# Patient Record
Sex: Female | Born: 1965 | Race: White | Hispanic: No | Marital: Single | Smoking: Former smoker
Health system: Southern US, Community
[De-identification: ages and names within clinical notes are randomized; demographics above are authoritative.]

## PROBLEM LIST (undated history)

## (undated) DIAGNOSIS — I493 Ventricular premature depolarization: Secondary | ICD-10-CM

## (undated) DIAGNOSIS — R42 Dizziness and giddiness: Secondary | ICD-10-CM

## (undated) DIAGNOSIS — H9201 Otalgia, right ear: Secondary | ICD-10-CM

## (undated) DIAGNOSIS — D509 Iron deficiency anemia, unspecified: Secondary | ICD-10-CM

## (undated) DIAGNOSIS — G43829 Menstrual migraine, not intractable, without status migrainosus: Secondary | ICD-10-CM

## (undated) DIAGNOSIS — R202 Paresthesia of skin: Secondary | ICD-10-CM

## (undated) DIAGNOSIS — R442 Other hallucinations: Secondary | ICD-10-CM

## (undated) DIAGNOSIS — Z8679 Personal history of other diseases of the circulatory system: Secondary | ICD-10-CM

## (undated) DIAGNOSIS — I491 Atrial premature depolarization: Secondary | ICD-10-CM

## (undated) DIAGNOSIS — R002 Palpitations: Secondary | ICD-10-CM

## (undated) DIAGNOSIS — E785 Hyperlipidemia, unspecified: Secondary | ICD-10-CM

## (undated) DIAGNOSIS — M542 Cervicalgia: Secondary | ICD-10-CM

## (undated) HISTORY — DX: Dizziness and giddiness: R42

## (undated) HISTORY — DX: Hyperlipidemia, unspecified: E78.5

## (undated) HISTORY — DX: Paresthesia of skin: R20.2

## (undated) HISTORY — DX: Cervicalgia: M54.2

## (undated) HISTORY — DX: Menstrual migraine, not intractable, without status migrainosus: G43.829

## (undated) HISTORY — DX: Other hallucinations: R44.2

## (undated) HISTORY — DX: Ventricular premature depolarization: I49.3

## (undated) HISTORY — DX: Atrial premature depolarization: I49.1

## (undated) HISTORY — DX: Otalgia, right ear: H92.01

## (undated) HISTORY — DX: Personal history of other diseases of the circulatory system: Z86.79

## (undated) HISTORY — DX: Palpitations: R00.2

## (undated) HISTORY — DX: Iron deficiency anemia, unspecified: D50.9

---

## 1997-12-15 ENCOUNTER — Other Ambulatory Visit: Admission: RE | Admit: 1997-12-15 | Discharge: 1997-12-15 | Payer: Self-pay | Admitting: Obstetrics and Gynecology

## 1998-06-01 ENCOUNTER — Inpatient Hospital Stay (HOSPITAL_COMMUNITY): Admission: AD | Admit: 1998-06-01 | Discharge: 1998-06-01 | Payer: Self-pay | Admitting: Obstetrics and Gynecology

## 1998-06-07 ENCOUNTER — Inpatient Hospital Stay (HOSPITAL_COMMUNITY): Admission: AD | Admit: 1998-06-07 | Discharge: 1998-06-09 | Payer: Self-pay | Admitting: Obstetrics and Gynecology

## 1998-07-10 ENCOUNTER — Other Ambulatory Visit: Admission: RE | Admit: 1998-07-10 | Discharge: 1998-07-10 | Payer: Self-pay | Admitting: Obstetrics and Gynecology

## 1998-07-14 ENCOUNTER — Encounter: Payer: Self-pay | Admitting: Emergency Medicine

## 1998-07-14 ENCOUNTER — Inpatient Hospital Stay (HOSPITAL_COMMUNITY): Admission: EM | Admit: 1998-07-14 | Discharge: 1998-07-18 | Payer: Self-pay | Admitting: Emergency Medicine

## 1999-08-07 ENCOUNTER — Other Ambulatory Visit: Admission: RE | Admit: 1999-08-07 | Discharge: 1999-08-07 | Payer: Self-pay | Admitting: Obstetrics and Gynecology

## 2000-11-17 ENCOUNTER — Other Ambulatory Visit: Admission: RE | Admit: 2000-11-17 | Discharge: 2000-11-17 | Payer: Self-pay | Admitting: Obstetrics and Gynecology

## 2000-12-28 ENCOUNTER — Encounter: Admission: RE | Admit: 2000-12-28 | Discharge: 2000-12-28 | Payer: Self-pay | Admitting: Emergency Medicine

## 2000-12-28 ENCOUNTER — Encounter: Payer: Self-pay | Admitting: Emergency Medicine

## 2001-04-25 ENCOUNTER — Encounter: Payer: Self-pay | Admitting: Emergency Medicine

## 2001-04-25 ENCOUNTER — Emergency Department (HOSPITAL_COMMUNITY): Admission: EM | Admit: 2001-04-25 | Discharge: 2001-04-25 | Payer: Self-pay | Admitting: Emergency Medicine

## 2001-12-28 ENCOUNTER — Other Ambulatory Visit: Admission: RE | Admit: 2001-12-28 | Discharge: 2001-12-28 | Payer: Self-pay | Admitting: Obstetrics and Gynecology

## 2002-01-26 ENCOUNTER — Encounter: Payer: Self-pay | Admitting: Emergency Medicine

## 2002-01-26 ENCOUNTER — Encounter: Admission: RE | Admit: 2002-01-26 | Discharge: 2002-01-26 | Payer: Self-pay | Admitting: Emergency Medicine

## 2002-02-01 ENCOUNTER — Encounter: Payer: Self-pay | Admitting: Emergency Medicine

## 2002-02-01 ENCOUNTER — Encounter: Admission: RE | Admit: 2002-02-01 | Discharge: 2002-02-01 | Payer: Self-pay | Admitting: Emergency Medicine

## 2003-02-27 ENCOUNTER — Other Ambulatory Visit: Admission: RE | Admit: 2003-02-27 | Discharge: 2003-02-27 | Payer: Self-pay | Admitting: Obstetrics and Gynecology

## 2003-11-06 ENCOUNTER — Inpatient Hospital Stay (HOSPITAL_COMMUNITY): Admission: EM | Admit: 2003-11-06 | Discharge: 2003-11-07 | Payer: Self-pay | Admitting: Emergency Medicine

## 2004-01-19 ENCOUNTER — Encounter: Admission: RE | Admit: 2004-01-19 | Discharge: 2004-01-19 | Payer: Self-pay | Admitting: Emergency Medicine

## 2004-05-02 ENCOUNTER — Ambulatory Visit (HOSPITAL_COMMUNITY): Admission: RE | Admit: 2004-05-02 | Discharge: 2004-05-02 | Payer: Self-pay | Admitting: Cardiovascular Disease

## 2004-05-06 ENCOUNTER — Other Ambulatory Visit: Admission: RE | Admit: 2004-05-06 | Discharge: 2004-05-06 | Payer: Self-pay | Admitting: Obstetrics and Gynecology

## 2004-10-07 ENCOUNTER — Other Ambulatory Visit: Admission: RE | Admit: 2004-10-07 | Discharge: 2004-10-07 | Payer: Self-pay | Admitting: Obstetrics and Gynecology

## 2004-10-10 ENCOUNTER — Encounter: Admission: RE | Admit: 2004-10-10 | Discharge: 2004-10-10 | Payer: Self-pay | Admitting: Obstetrics and Gynecology

## 2005-07-02 ENCOUNTER — Other Ambulatory Visit: Admission: RE | Admit: 2005-07-02 | Discharge: 2005-07-02 | Payer: Self-pay | Admitting: Obstetrics and Gynecology

## 2006-02-17 ENCOUNTER — Ambulatory Visit: Payer: Self-pay | Admitting: Cardiovascular Disease

## 2006-03-11 ENCOUNTER — Encounter: Payer: Self-pay | Admitting: Internal Medicine

## 2006-03-11 ENCOUNTER — Ambulatory Visit: Payer: Self-pay

## 2006-03-11 HISTORY — PX: TRANSTHORACIC ECHOCARDIOGRAM: SHX275

## 2006-10-01 ENCOUNTER — Ambulatory Visit: Payer: Self-pay | Admitting: Cardiovascular Disease

## 2006-11-13 ENCOUNTER — Encounter: Admission: RE | Admit: 2006-11-13 | Discharge: 2006-11-13 | Payer: Self-pay | Admitting: Otolaryngology

## 2006-12-22 ENCOUNTER — Encounter: Admission: RE | Admit: 2006-12-22 | Discharge: 2006-12-22 | Payer: Self-pay | Admitting: Emergency Medicine

## 2007-03-11 ENCOUNTER — Encounter: Admission: RE | Admit: 2007-03-11 | Discharge: 2007-06-09 | Payer: Self-pay | Admitting: Neurology

## 2007-06-16 ENCOUNTER — Encounter: Admission: RE | Admit: 2007-06-16 | Discharge: 2007-06-16 | Payer: Self-pay | Admitting: Endocrinology

## 2007-10-12 ENCOUNTER — Ambulatory Visit: Payer: Self-pay | Admitting: Cardiovascular Disease

## 2008-06-26 ENCOUNTER — Encounter: Admission: RE | Admit: 2008-06-26 | Discharge: 2008-06-26 | Payer: Self-pay | Admitting: Endocrinology

## 2008-10-24 ENCOUNTER — Emergency Department (HOSPITAL_COMMUNITY): Admission: EM | Admit: 2008-10-24 | Discharge: 2008-10-24 | Payer: Self-pay | Admitting: Emergency Medicine

## 2008-11-15 DIAGNOSIS — R002 Palpitations: Secondary | ICD-10-CM | POA: Insufficient documentation

## 2008-11-15 DIAGNOSIS — I428 Other cardiomyopathies: Secondary | ICD-10-CM | POA: Insufficient documentation

## 2008-11-15 DIAGNOSIS — R209 Unspecified disturbances of skin sensation: Secondary | ICD-10-CM | POA: Insufficient documentation

## 2008-11-20 ENCOUNTER — Encounter: Payer: Self-pay | Admitting: Cardiovascular Disease

## 2008-11-20 ENCOUNTER — Ambulatory Visit: Payer: Self-pay

## 2008-11-20 ENCOUNTER — Ambulatory Visit: Payer: Self-pay | Admitting: Cardiovascular Disease

## 2009-09-12 ENCOUNTER — Encounter (INDEPENDENT_AMBULATORY_CARE_PROVIDER_SITE_OTHER): Payer: Self-pay | Admitting: *Deleted

## 2009-12-20 ENCOUNTER — Telehealth: Payer: Self-pay | Admitting: Cardiovascular Disease

## 2010-01-28 ENCOUNTER — Ambulatory Visit: Payer: Self-pay | Admitting: Cardiovascular Disease

## 2010-03-11 ENCOUNTER — Telehealth (INDEPENDENT_AMBULATORY_CARE_PROVIDER_SITE_OTHER): Payer: Self-pay | Admitting: *Deleted

## 2010-04-03 ENCOUNTER — Ambulatory Visit: Payer: Self-pay | Admitting: Cardiovascular Disease

## 2010-04-03 DIAGNOSIS — I10 Essential (primary) hypertension: Secondary | ICD-10-CM | POA: Insufficient documentation

## 2010-04-08 ENCOUNTER — Telehealth (INDEPENDENT_AMBULATORY_CARE_PROVIDER_SITE_OTHER): Payer: Self-pay | Admitting: *Deleted

## 2010-04-09 ENCOUNTER — Ambulatory Visit: Payer: Self-pay | Admitting: Cardiovascular Disease

## 2010-04-16 ENCOUNTER — Encounter: Payer: Self-pay | Admitting: Pulmonary Disease

## 2010-04-18 ENCOUNTER — Telehealth: Payer: Self-pay | Admitting: Cardiology

## 2010-05-17 ENCOUNTER — Ambulatory Visit: Payer: Self-pay | Admitting: Pulmonary Disease

## 2010-05-21 ENCOUNTER — Ambulatory Visit: Payer: Self-pay | Admitting: Cardiovascular Disease

## 2010-06-06 DIAGNOSIS — R079 Chest pain, unspecified: Secondary | ICD-10-CM | POA: Insufficient documentation

## 2010-06-12 ENCOUNTER — Telehealth (INDEPENDENT_AMBULATORY_CARE_PROVIDER_SITE_OTHER): Payer: Self-pay | Admitting: *Deleted

## 2010-06-23 ENCOUNTER — Encounter: Payer: Self-pay | Admitting: Endocrinology

## 2010-07-04 NOTE — Progress Notes (Signed)
Summary: event monitor  Phone Note Outgoing Call Call back at [phone]   Call placed by: Stanton Kidney, EMT-P,  April 08, 2010 11:46 AM Summary of Call: Left message for Pt. to call back for event monitor. Stanton Kidney, EMT-P  April 08, 2010 11:47 AM  Pt. left message for me to call back, when call returned, LMAM. Stanton Kidney, EMT-P  April 09, 2010 10:58 AM  S/W pt, requested monitor to be mailed, pt enrolled 04/09/10. Stanton Kidney, EMT-P  April 09, 2010 11:10 AM

## 2010-07-04 NOTE — Assessment & Plan Note (Signed)
Summary: heart fluttering/dizzy/heart rate fluctuating/mt   CC:  dizziness and fast heart rate.  History of Present Illness: Lauren [name] is seen today for palpitaitons and ? increasing HTN.  She has felt "weird" for 2-3 weeks and describes being "foggy in the head" no focal neuro signs.  Undsteady on feet.  No fever headache.  Thinks BP has been higher with diastolics of .  Feels rapid palpitaoitns and HR seems inappropriately high at times.  No SSCP, dyspnea or edema.  No postural componant.  History of PPDCM with recovery of function.  History of anxiety.  Has been taking altace since last visit.  Current Problems (verified): 1)  Palpitations  (ICD-785.1) 2)  Cardiomyopathy  (ICD-425.4) 3)  Paresthesia  (ICD-782.0)  Current Medications (verified): 1)  Altace 5 Mg Caps (Ramipril) .Marland Kitchen.. 1 Tab By Mouth Once Daily 2)  Aspirin .Marland Kitchen.. 1 Tab  Couple Times A Week  Allergies (verified): No Known Drug Allergies  Past History:  Past Medical History: Last updated: 11/15/2008 Current Problems:  PALPITATIONS (ICD-785.1) CARDIOMYOPATHY (ICD-425.4): post-partum 2002 PARESTHESIA (ICD-782.0)   Cervical spine problems  Right ear pain.  Family History: Last updated: 11/15/2008 noncontributory  Social History: Last updated: 11/15/2008 She is married.  She has two children ages 32 and 39.  She  drinks weekly.  She does not use cigarettes or recreational drugs.  Review of Systems       Denies fever, malais, weight loss, blurry vision, decreased visual acuity, cough, sputum, SOB, hemoptysis, pleuritic pain,, heartburn, abdominal pain, melena, lower extremity edema, claudication, or rash.   Vital Signs:  Patient profile:   45 year old female Height:      63 inches Weight:      113 pounds BMI:     20.09 Pulse rate:   78 / minute Resp:     14 per minute BP sitting:   130 / 80  (left arm) BP standing:   130 / 80  Vitals Entered By: Kem Parkinson (April 03, 2010 2:14 PM)  Physical  Exam  General:  Affect appropriate Healthy:  appears stated age HEENT: normal Neck supple with no adenopathy JVP normal no bruits no thyromegaly Lungs clear with no wheezing and good diaphragmatic motion Heart:  S1/S2 no murmur,rub, gallop or click PMI normal Abdomen: benighn, BS positve, no tenderness, no AAA no bruit.  No HSM or HJR Distal pulses intact with no bruits No edema Neuro non-focal Skin warm and dry    Impression & Recommendations:  Problem # 1:  PALPITATIONS (ICD-785.1) Event monitor sound benign   Her updated medication list for this problem includes:    Altace 5 Mg Caps (Ramipril) .Marland Kitchen... 1 tab by mouth once daily  Orders: Event (Event)  Problem # 2:  CARDIOMYOPATHY (ICD-425.4) No CHF with normalizaton of EF  Continue ACE Her updated medication list for this problem includes:    Altace 5 Mg Caps (Ramipril) .Marland Kitchen... 1 tab by mouth once daily  Problem # 3:  PARESTHESIA (ICD-782.0) ? URI or inner ear issue.  F/U primary If symtoms worsen or persist consdier CT/MRI  Problem # 4:  ESSENTIAL HYPERTENSION, BENIGN (ICD-401.1) Home BP reading consider adding BB or increasing ACE Her updated medication list for this problem includes:    Altace 5 Mg Caps (Ramipril) .Marland Kitchen... 1 tab by mouth once daily  Patient Instructions: 1)  Your physician recommends that you schedule a follow-up appointment in: 6-8 WEEKS WITH DR Eden Emms 2)  Your physician recommends that you continue on your  current medications as directed. Please refer to the Current Medication list given to you today. 3)  Your physician has recommended that you wear an event monitor.  Event monitors are medical devices that record the heart's electrical activity. Doctors most often use these monitors to diagnose arrhythmias. Arrhythmias are problems with the speed or rhythm of the heartbeat. The monitor is a small, portable device. You can wear one while you do your normal daily activities. This is usually used to  diagnose what is causing palpitations/syncope (passing out).

## 2010-07-04 NOTE — Progress Notes (Signed)
Summary: [facility] calling re pt's monitor  Phone Note From Other Clinic   Caller: [facility] [phone] Summary of Call: [facility] calling to let us know they have attempted to contact this pt re a monitor on four different occasions without success  Initial call taken by: Glynda Jaeger,  April 18, 2010 12:59 PM  Follow-up for Phone Call        ok, just document Follow-up by: Gaylord Shih, MD, [facility],  April 18, 2010 1:55 PM

## 2010-07-04 NOTE — Letter (Signed)
Summary: Appointment - Reminder 2  [facility] [employer], Main Office  [address]   [place], [place] [address]   Phone: [phone]  Fax: [phone]     September 12, 2009 MRN: [id number]   [facility] [address] [place], [place]  [address]   Dear Ms. Guia,  Our records indicate that it is time to schedule a follow-up appointment with Dr. Eden Emms. It is very important that we reach you to schedule this appointment. We look forward to participating in your health care needs. Please contact us at the number listed above at your earliest convenience to schedule your appointment.  If you are unable to make an appointment at this time, give [place] a call so we can update our records.     Sincerely,   Migdalia Dk [facility] Scheduling Team

## 2010-07-04 NOTE — Assessment & Plan Note (Signed)
Summary: consult for right sided chest pain   Copy to:  Herb Grays Primary Provider/Referring Provider:  Herb Grays  CC:  Pulmonary Consult.  History of Present Illness: The pt is a 45y/o female who I have been asked to see for right sided chest discomfort.  The pt has been having this pain for 6mos, and feels it has been staying the same intensity to better.  She states the pain is clearly positional, and is made worse by driving.  She thinks this is because she leans in a certain position whenever she drives.  The same position when not driving can also make the pain worse.  She denies any point tenderness, but it is worse with turning her torso.  She has no issues with her breathing, and denies any cough or mucus production.  She denies having any issue with the pain on vigorous exercise, and does not interfere with her exercise tolerance.  She has had rib films in Nov, with no acute process seen.  The pain is not pleuritic, and she has not had any LE edema.  No FHx of VTE or autoimmune disease.    Current Medications (verified): 1)  Altace 5 Mg Caps (Ramipril) .Marland Kitchen.. 1 Tab By Mouth Once Daily 2)  Aspirin .Marland Kitchen.. 1 Tab  Couple Times A Week 3)  Iron 325 (65 Fe) Mg Tabs (Ferrous Sulfate) .... Take 1 Tablet By Mouth Two Times A Day 4)  Vitamin B6 and B12 .... Take 1 Tablet By Mouth Once A Day  Allergies (verified): 1)  ! Doxycycline  Past History:  Past Medical History:  PALPITATIONS (ICD-785.1) CARDIOMYOPATHY (ICD-425.4): post-partum 2002 PARESTHESIA (ICD-782.0)   Cervical spine problems  Right ear pain.  Past Surgical History: denies surgical history  Family History: Reviewed history from 11/15/2008 and no changes required. cancer: paternal grandmother (unsure what kind) maternal grandmother ( female reprodutive organs)   Social History: Reviewed history from 11/15/2008 and no changes required. She is married.  She has two children ages 45 and 64.   pt works as a Economist.  Former smoker.  started at age 45.  less than 1 ppd.  quit 1990. drinks alcohol.  approx 3 to 5 drinks a week.   Review of Systems       The patient complains of irregular heartbeats, anxiety, and joint stiffness or pain.  The patient denies shortness of breath with activity, shortness of breath at rest, productive cough, non-productive cough, coughing up blood, chest pain, acid heartburn, indigestion, loss of appetite, weight change, abdominal pain, difficulty swallowing, sore throat, tooth/dental problems, headaches, nasal congestion/difficulty breathing through nose, sneezing, itching, ear ache, depression, hand/feet swelling, rash, change in color of mucus, and fever.    Vital Signs:  Patient profile:   45 year old female Height:      63 inches Weight:      117.25 pounds BMI:     20.84 O2 Sat:      96 % on Room air Temp:     98.1 degrees F oral Pulse rate:   62 / minute BP sitting:   104 / 78  (left arm) Cuff size:   regular  Vitals Entered By: Arman Filter LPN (June 06, 2010 1:45 PM)  O2 Flow:  Room air CC: Pulmonary Consult Comments Medications reviewed with patient Arman Filter LPN  June 06, 2010 1:45 PM    Physical Exam  General:  thin female in nad Eyes:  PERRLA.   Nose:  patent without discharge Mouth:  no exudates or lesions seen Neck:  no jvd, tmg, LN Lungs:  totally clear to auscultation Heart:  rrr, no mrg Abdomen:  soft and nontender, bs+ Extremities:  no edema or cyanosis  Neurologic:  alert, oriented, moves all 4.   Impression & Recommendations:  Problem # 1:  CHEST PAIN (ICD-786.50)  the pt is describing chest pain that I suspect is MSK in origin.  Her recent cxr/rib films showed no acute process, her description of the discomfort is not pleuritic in nature, and the pain can be worsened by twisting/changing position.  She is able to exercise vigorously without this bothering her, and without any pulmonary limitation.  I  suspect her discomfort is in the chest wall rather than in lungs.  I have left the door open with her that if this continues to be an issue or get worse, could consider getting ct chest for completeness.  Medications Added to Medication List This Visit: 1)  Iron 325 (65 Fe) Mg Tabs (Ferrous sulfate) .... Take 1 tablet by mouth two times a day 2)  Vitamin B6 and B12  .... Take 1 tablet by mouth once a day  Other Orders: Consultation Level IV ([address])  Patient Instructions: 1)  no further pulmonary w/u at this time.  I suspect this is in the chest wall.   2)  please call if symptoms worsen or persist.

## 2010-07-04 NOTE — Assessment & Plan Note (Signed)
Summary: follow up/mt   History of Present Illness: Lauren [name] is seen today in followup for atypical chest pain palpitations and history of dilated cardiomyopathy postpartum.  She had a 2-D echocardiogram 10/2008  which I reviewed.  He was normal with an EF of 55% and trivial MR.  She's done well.  She was also seen in the ER in 2010 for "panic attack with negative w.u and R/O.   Her 2 children and husband seemed to be doing well she's probably had 3 panic attacks in the last 3 years.  Nothing in particular brings them on.  She has no history coronary artery disease she has been taking her altace infrequently maybe 2-3 times per week.  She will call us to re-evalute EF if she stops her altace alltogether  Current Problems (verified): 1)  Palpitations  (ICD-785.1) 2)  Cardiomyopathy  (ICD-425.4) 3)  Paresthesia  (ICD-782.0)  Current Medications (verified): 1)  Altace 5 Mg Caps (Ramipril) .Marland Kitchen.. 1 Tab By Mouth Once Daily 2)  Aspirin .Marland Kitchen.. 1 Tab  Couple Times A Week  Allergies (verified): No Known Drug Allergies  Past History:  Past Medical History: Last updated: 11/15/2008 Current Problems:  PALPITATIONS (ICD-785.1) CARDIOMYOPATHY (ICD-425.4): post-partum 2002 PARESTHESIA (ICD-782.0)   Cervical spine problems  Right ear pain.  Family History: Last updated: 11/15/2008 noncontributory  Social History: Last updated: 11/15/2008 She is married.  She has two children ages 45 and 31.  She  drinks weekly.  She does not use cigarettes or recreational drugs.  Review of Systems       Denies fever, malais, weight loss, blurry vision, decreased visual acuity, cough, sputum, SOB, hemoptysis, pleuritic pain, palpitaitons, heartburn, abdominal pain, melena, lower extremity edema, claudication, or rash.   Vital Signs:  Patient profile:   45 year old female Height:      63 inches Weight:      115 pounds BMI:     20.44 Pulse rate:   63 / minute Resp:     12 per minute BP sitting:   116 / 68   (left arm)  Vitals Entered By: Kem Parkinson (January 28, 2010 4:23 PM)  Physical Exam  General:  Affect appropriate Healthy:  appears stated age HEENT: normal Neck supple with no adenopathy JVP normal no bruits no thyromegaly Lungs clear with no wheezing and good diaphragmatic motion Heart:  S1/S2 no murmur,rub, gallop or click PMI normal Abdomen: benighn, BS positve, no tenderness, no AAA no bruit.  No HSM or HJR Distal pulses intact with no bruits No edema Neuro non-focal Skin warm and dry    Impression & Recommendations:  Problem # 1:  CARDIOMYOPATHY (ICD-425.4) Normal ECG, and normal exam  Continue ACE.  She will call us if she stops it to re-evaluate EF off meds Her updated medication list for this problem includes:    Altace 5 Mg Caps (Ramipril) .Marland Kitchen... 1 tab by mouth once daily  Orders: EKG w/ Interpretation (93000)  Her updated medication list for this problem includes:    Altace 5 Mg Caps (Ramipril) .Marland Kitchen... 1 tab by mouth once daily  Patient Instructions: 1)  Your physician recommends that you schedule a follow-up appointment in: 1 YEAR.   EKG Report  Procedure date:  01/28/2010  Findings:      NSR 63 Normal ECG

## 2010-07-04 NOTE — Progress Notes (Signed)
Summary: does pt need echo  Phone Note Call from Patient   Caller: Patient Reason for Call: Talk to Nurse Summary of Call: pt wants to know if she needs an echo before her next visit? pls call [phone] Initial call taken by: Glynda Jaeger,  December 20, 2009 1:34 PM  Follow-up for Phone Call        [facility] Lisabeth Devoid RN No answer 2nd call at 5:02pm Lisabeth Devoid RN lmtcb Scherrie Bateman, LPN  December 21, 2009 8:24 AM  Additional Follow-up for Phone Call Additional follow up Details #1::        She does ot need and echo  Additional Follow-up by: Colon Branch, MD, [facility],  December 21, 2009 1:04 PM    Additional Follow-up for Phone Call Additional follow up Details #2::    I have left a message on the pt's identified vm that she does not need an echo prior to her visit. Follow-up by: Sherri Rad, RN, BSN,  December 21, 2009 1:26 PM

## 2010-07-04 NOTE — Progress Notes (Signed)
Summary: refill request  Phone Note Refill Request Message from:  Patient on March 11, 2010 9:54 AM  Refills Requested: Medication #1:  ALTACE 5 MG CAPS 1 tab by mouth once daily [place]/ pt #[id number]   Method Requested: Telephone to Pharmacy Initial call taken by: Glynda Jaeger,  March 11, 2010 9:54 AM    Prescriptions: ALTACE 5 MG CAPS (RAMIPRIL) 1 tab by mouth once daily  #30 x 12   Entered by:   Kem Parkinson   Authorized by:   Colon Branch, MD, [facility]   Signed by:   Kem Parkinson on 03/11/2010   Method used:   Electronically to        [facility]* (retail)       [address]       [place], [place]  [id number]       Ph: [phone]       Fax: [phone]   RxID:   [id number]

## 2010-07-04 NOTE — Progress Notes (Signed)
Summary: lmtcb /.cy  Phone Note Outgoing Call   Call placed by: Scherrie Bateman, LPN,  June 12, 2010 3:25 PM Call placed to: Patient Summary of Call: LM FOR PT TO CALL BACK  RE MONITOR PER DR Eden Emms SR WITH PAC'S NO SIG ARRYTHMIAS Initial call taken by: Scherrie Bateman, LPN,  June 12, 2010 3:26 PM  Follow-up for Phone Call        [facility] Scherrie Bateman, LPN  June 14, 2010 12:36 PM  [facility] Scherrie Bateman, LPN  June 18, 2010 9:57 AM   pt returned call-pls call [phone] Glynda Jaeger  June 18, 2010 10:07 AM  Additional Follow-up for Phone Call Additional follow up Details #1::        PT AWARE. Additional Follow-up by: Scherrie Bateman, LPN,  June 18, 2010 10:36 AM

## 2010-09-10 LAB — URINALYSIS, ROUTINE W REFLEX MICROSCOPIC
Bilirubin Urine: NEGATIVE
Glucose, UA: NEGATIVE mg/dL
Hgb urine dipstick: NEGATIVE
Ketones, ur: NEGATIVE mg/dL
Nitrite: NEGATIVE
Specific Gravity, Urine: 1.005 (ref 1.005–1.030)

## 2010-09-10 LAB — POCT I-STAT, CHEM 8
BUN: 14 mg/dL (ref 6–23)
Hemoglobin: 13.6 g/dL (ref 12.0–15.0)

## 2010-10-15 NOTE — Assessment & Plan Note (Signed)
[facility]                            CARDIOLOGY OFFICE NOTE   NAME:[name], Lauren A                   MRN:          [id number]  DATE:10/12/2007                            DOB:          [birth year]    HISTORY:  Lauren [name] returns today for followup.  She has had a  history of postpartum cardiomyopathy.   She has had this decrease in LV function with an EF of 15-20% with  moderate MR in January 2000.  I started seeing her back in 2007.  At  that time, her EF had improved to 50-55%.  She is doing well.  She has  also had palpitations, occasional PVCs and PVCs.  She seemed to be  improved.  Hamida is doing well.  She is not having any significant  palpitations, no shortness of breath.  In fact, she just completed a  mini-triathlon in [place].  She did well and actually beat her  husband's time.  She has not had any syncope, no lower extremity edema.  No PND or orthopnea.   REVIEW OF SYSTEMS:  Remarkable for some right ear pain.  She has seen  some people for this.  It is not clear what the etiology is.  She also  has had some neck pain and is seeing a [facility].  Her view of  systems otherwise negative.   MEDICATIONS:  Altace 5 mg a day.   PHYSICAL EXAMINATION:  GENERAL:  Exam is remarkable for a thin, healthy  white female in no distress.  Affect is appropriate.  VITAL SIGNS:  Weight is 115 which is stable.  Blood pressure is 105/69,  pulse 74 and regular, respiratory rate 14, afebrile.  HEENT:  Unremarkable.  I did look in her right ear and it was normal  with a good light reflex.  Normal tympanic membrane.  NECK:  Carotids are normal without bruit.  No lymphadenopathy,  thyromegaly or JVP elevation.  LUNGS:  Clear with good diaphragmatic motion.  No wheezing.  HEART:  S1 and S2 with normal heart sounds.  PMI normal.  ABDOMEN:  Benign.  Bowel sounds are positive.  No AAA, no tenderness, no  hepatosplenomegaly, no hepatojugular  reflux.  EXTREMITIES:  Distal pulses are intact, no edema.  NEURO:  Nonfocal.  SKIN:  Warm and dry.  No muscular weakness.   DIAGNOSTICS:  EKG is normal.   IMPRESSION:  1. History of dilated cardiomyopathy with ejection fraction 15-20% in      2000, improved.  I told the patient she could probably come off      Altace for the time being.  She will take it every other day.  She      is somewhat wedded to the drug and deems that it helps with her      improvement.  2. Right ear pain.  Follow up ENT.  No obvious tympanic membrane or      ear canal problems.  3. Cervical spine problems.  Continue to follow up with chiropractor      and continue with nerve stimulation therapy.  4. Cardiomyopathy seems to  be improved.  No functional limitations.      Follow up with me  May 2010 with echocardiogram.  Her last      echocardiogram was done in October 2007.     Noralyn Pick. Eden Emms, MD, [facility]  Electronically Signed    PCN/MedQ  DD: 10/12/2007  DT: 10/12/2007  Job #: [id number]

## 2010-10-18 NOTE — Discharge Summary (Signed)
NAMEJADAN, Lauren [name]                      ACCOUNT NO.:  [id number]   MEDICAL RECORD NO.:  [id number]                   PATIENT TYPE:  INP   LOCATION:  0343                                 FACILITY[facility]  [facility]   PHYSICIAN:  Charlies Constable, M.D. [facility]              DATE OF BIRTH:  1966-05-31   DATE OF ADMISSION:  11/06/2003  DATE OF DISCHARGE:  11/07/2003                           DISCHARGE SUMMARY - REFERRING   HISTORY:  Lauren [name] is a 45 year old white female who presented to the  emergency room with an episode of nausea, diarrhea that she had earlier on  the morning of admission after completing a 3-mile run post breakfast.  She  then later experienced intermittent palpitations associated with dizziness  and shortness of breath.  The patient could not tell if the heart rate was  going fast nor did she describe associated chest discomfort or loss of  consciousness.  Later that morning while at [facility] developing pictures  this occurred.  She came to the emergency room still complaining with  increased heart rate occasionally but the ER did not have any documentation.   Her history is notable for a postpartum cardiomyopathy in February of 2000.  Her EF per the patient was 16 to 20%. However, subsequent echocardiogram 6  months later showed an EF of 55% and an echocardiogram last year continued  to show EF of approximately 55%.  She was actually seen by Dr. Daleen Squibb  initially at [facility] but has been following up with a [facility]  cardiologist.   LABORATORY DATA:  Chest x-ray did not show any active disease.  H&H 13.5 and  38.9 normal indices, platelets 221, WBCs 6.8.  Sodium 140, potassium 3.9,  BUN 12, creatinine 0.7, magnesium 1.9.  TSH 1.022.  EKG is normal sinus  rhythm with a rate of 72, normal axis artifact, delayed R-wave.   HOSPITAL COURSE:  Lauren [name] was admitted to [facility] by Dr.  Graciela Husbands for observation.  Overnight she did not have any further symptoms  and  the monitor continued to show sinus bradycardia with normal sinus rhythm  without any ectopy.  Dr. Juanda Chance, after review of laboratory data and the  monitor, felt that the patient could be discharged home.  He felt that she  should be placed on a beta-blocker with follow up echocardiogram and  cardiology appointment.  He also noted at the time of discharge and  explained to the patient if she has recurring symptoms while on a beta-  blocker an event monitor should be obtained.   DISCHARGE DIAGNOSIS:  Tachypalpitations.   DISPOSITION:  She is discharged home on a new prescription of Toprol-XL 25  mg daily and asked to continue her Altace 5 mg daily.   RECOMMENDATIONS:  A low salt/fat/cholesterol diet.  She will have an  echocardiogram on Friday, June 10th, at 5 p.m. and she will also follow up  with Dr. Daleen Squibb  in the [place] office.     Joellyn Rued, P.A. [facility]                    Charlies Constable, M.D. [facility]    EW/MEDQ  D:  11/07/2003  T:  11/07/2003  Job:  [id number]   cc:   Reuben Likes, M.D.  [address]  [place]  [place] [address]  Fax: [phone]   Pricilla Riffle, M.D.

## 2010-10-18 NOTE — H&P (Signed)
Lauren [name], Lauren [name]                      ACCOUNT NO.:  [id number]   MEDICAL RECORD NO.:  [id number]                   PATIENT TYPE:  INP   LOCATION:  0102                                 FACILITY:  [facility]   PHYSICIAN:  Duke Salvia, M.D.               DATE OF BIRTH:  [birth year]   DATE OF ADMISSION:  11/06/2003  DATE OF DISCHARGE:                                HISTORY & PHYSICAL   HISTORY OF PRESENT ILLNESS:  Lauren [name] is a 46 year old woman with a  history of postpartum cardiomyopathy about 5 years ago following the birth  of her second child where her ejection fraction fell to about 15-20%.  There  was subsequent recovery of her ejection fraction over the next 6 months and  has been followed by Dr. Perlie Gold down at [facility] since then until his recent  departure.  She has had no congestive symptoms; she runs vigorously and is  quite fit.   She does have a history of occasional palpitations which she describes as a  pause and a hard heartbeat and calls these PVCs; she is probably right.  In  addition, she describes palpitations that are different from this that are  often irregular, fast, and last 15-20 seconds.   Today, having run 3-4 miles in the morning (temperature 70-75) she returned  home, had some diarrhea related to nausea, and then from there went to do  some chores with her son.  Her volume repletion is solely water.  She then  had the onset of these palpitations lasting 15-30 seconds.  She describes  them as irregular.  She taps them out as mildly fast.  They were associated  with lightheadedness.  They then abated.  Some minutes later they recurred,  again lasting 15-30 seconds, and this occurred on a third occasion and  finally came to the emergency room.   She was put on the monitor and then apparently had another similar set of  palpitations.  Unfortunately her telemetry strips are not marked.   The strips recorded at 1400 appear to be relatively normal with  mild P wave  morphology variations and consistent with a sinus arrhythmia and at 1635  there is a pulse of about 120 beats per minute.   Her past evaluation for this has included a Holter monitor showing PVCs.  It  has been felt because of normal pulses during some of these episodes that  these may be anxiety.   She does use caffeine - a couple of Cokes a day.   Her cardiac risk factors are otherwise negative.   MEDICATIONS:  Altace 5.   ALLERGIES:  No known drug allergies.   REVIEW OF SYSTEMS:  Noted on intake sheet from Mr. Rozell Searing and is not  recounted here but is also normal.   SOCIAL HISTORY:  She is married.  She has two children ages 58 and 8.  She  drinks weekly.  She does not use cigarettes or recreational drugs.   There is no prior surgery.   PHYSICAL EXAMINATION:  GENERAL:  She is a middle-aged Caucasian female in no  acute distress, somewhat anxious.  VITAL SIGNS:  Her blood pressure is 130/80, her pulse is 78 to 94, her  respirations were 16 and nonlabored, she was afebrile.  HEENT:  Demonstrated no icterus and no xanthomata.  Neck veins were flat.  The carotids were brisk and full bilaterally without bruits.  BACK:  Without kyphosis or scoliosis.  LUNGS:  Clear.  HEART:  Heart sounds were regular.  An S4 was appreciated by Mr. Serpe I did  not appreciate.  ABDOMEN:  Soft with active bowel sounds with a palpable midline pulsation  consistent with her relative thinness.  EXTREMITIES:  Demonstrated no clubbing, cyanosis, or edema and pulses were  intact.  NEUROLOGIC:  Grossly normal.   Electrocardiogram dated 1145 demonstrated sinus rhythm at 72 with intervals  of 0.19/0.07/0.39.  There are suggestions of a prior septal myocardial  infarction though I suspect that this is related to lead placement.   Electrolytes demonstrated a sodium of 140, potassium of 3.9, a magnesium of  1.9 (I think it was - although I know it was in normal range).   Hemoglobin was  also normal at 13.5.   IMPRESSION:  1. Tachypalpitations with some irregularity by history though no strips are     available of this.  2. 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  3. History of premature ventricular contractions.  4. History of anxiety.   Lauren [name] has palpitations today, the specifics of which are not clear.  What she describes sounds like frequent ectopy; I wonder whether this might  have been precipitated by her early morning run, relative electrolyte  depletion, with subsequent resolution as homeostasis is reestablished.  However, given her cardiomyopathy - albeit healed, it is reasonable to  observe her overnight.  Whether event recording will be useful will be  determined by the frequency of these episodes.   Based on the above, therefore:  1. Observe her overnight.  2. Review telemetry in the a.m. prior to discharge.  3. She can follow up with me or Dr. Antoine Poche if she desires or follow up     with [facility] as previously set.                                               Duke Salvia, M.D.    SCK/MEDQ  D:  11/06/2003  T:  11/07/2003  Job:  [id number]   cc:   Dr. Ardyth Harps at [facility], M.D.  [address]  [place]  [place] [address]  Fax: [phone]

## 2010-10-24 ENCOUNTER — Telehealth: Payer: Self-pay | Admitting: *Deleted

## 2010-10-24 ENCOUNTER — Telehealth: Payer: Self-pay | Admitting: Cardiovascular Disease

## 2010-10-24 ENCOUNTER — Ambulatory Visit (INDEPENDENT_AMBULATORY_CARE_PROVIDER_SITE_OTHER): Payer: PRIVATE HEALTH INSURANCE

## 2010-10-24 VITALS — BP 127/74 | HR 73 | Resp 18 | Ht 64.0 in | Wt 115.0 lb

## 2010-10-24 DIAGNOSIS — R002 Palpitations: Secondary | ICD-10-CM

## 2010-10-24 NOTE — Telephone Encounter (Signed)
Patient called with C/O of palpitations with lightheadedness. This is  happening more frequently  Now. She notice these symptoms more when at rest. Patient would like to be seen today. An appointment is made for an EKG with the nurse today at 12:30 PM. Patient aware.

## 2010-10-24 NOTE — Progress Notes (Signed)
Patient c/o of palpitations every 10 to 15 minutes. For the last 3 days. It comes in clusters, lasting for one minute. Patient feels lightheaded when it happened. Patient denies SOB.  B/P 126/74. Pt. Appears nervous and crying at times. She states she is having a stressful time.An EKG was done. EKG having PVC's per Dr. Eden Emms. Patient to have an appointment within a month or so. An appointment was made for patient on July 30 th at 2:00 Pm. Patient aware.

## 2010-10-24 NOTE — Telephone Encounter (Signed)
Per pt calling this am wanted to be seen today. C /O palpitation for the past few days. No chest pain.

## 2010-10-30 NOTE — Telephone Encounter (Signed)
none

## 2010-12-11 ENCOUNTER — Encounter: Payer: Self-pay | Admitting: Cardiovascular Disease

## 2010-12-30 ENCOUNTER — Ambulatory Visit (INDEPENDENT_AMBULATORY_CARE_PROVIDER_SITE_OTHER): Payer: PRIVATE HEALTH INSURANCE | Admitting: Cardiovascular Disease

## 2010-12-30 DIAGNOSIS — I10 Essential (primary) hypertension: Secondary | ICD-10-CM

## 2010-12-30 DIAGNOSIS — R002 Palpitations: Secondary | ICD-10-CM

## 2010-12-30 DIAGNOSIS — I4949 Other premature depolarization: Secondary | ICD-10-CM

## 2010-12-30 DIAGNOSIS — I493 Ventricular premature depolarization: Secondary | ICD-10-CM

## 2010-12-30 DIAGNOSIS — I428 Other cardiomyopathies: Secondary | ICD-10-CM

## 2010-12-30 HISTORY — PX: OTHER SURGICAL HISTORY: SHX169

## 2010-12-30 NOTE — Assessment & Plan Note (Signed)
BP under good control with ACE.  Low sodium diet.

## 2010-12-30 NOTE — Assessment & Plan Note (Signed)
Last imaging 2 years ago with PVC;s  Echo with stress

## 2010-12-30 NOTE — Patient Instructions (Addendum)
Your physician recommends that you schedule a follow-up appointment in: 6 MONTHS WITH DR [facility]   Your physician recommends that you continue on your current medications as directed. Please refer to the Current Medication list given to you today. Your physician has requested that you have a stress echocardiogram. For further information please visit [email or web address] Please follow instruction sheet as given. PT'S CONVENIENCE  DX 785.1  PVC'S  You have been referred to EP DX 785.1  AND PVC'S

## 2010-12-30 NOTE — Progress Notes (Signed)
Lauren [name] is seen today in followup for atypical chest pain palpitations and history of dilated cardiomyopathy postpartum. She had a 2-D echocardiogram 10/2008 which I reviewed. He was normal with an EF of 55% and trivial MR. She's done well. She was also seen in the ER in 2010 for "panic attack with negative w.u and R/O. Her 2 children and husband seemed to be doing well she's probably had 3 panic attacks in the last 3 years. Nothing in particular brings them on. She has no history coronary artery disease she has been taking her altace more regularly.    Seen in office for nurse visit 5/24.  Under a lot of stress. ECG with PVc;s  Still with palpitations that are ? Hormonal and definitely related to stress.  Not a candidate for BB due to low resting HR.     ROS: Denies fever, malais, weight loss, blurry vision, decreased visual acuity, cough, sputum, SOB, hemoptysis, pleuritic pain, palpitaitons, heartburn, abdominal pain, melena, lower extremity edema, claudication, or rash.  All other systems reviewed and negative  General: Affect appropriate Healthy:  appears stated age HEENT: normal Neck supple with no adenopathy JVP normal no bruits no thyromegaly Lungs clear with no wheezing and good diaphragmatic motion Heart:  S1/S2 no murmur,rub, gallop or click PMI normal Abdomen: benighn, BS positve, no tenderness, no AAA no bruit.  No HSM or HJR Distal pulses intact with no bruits No edema Neuro non-focal Skin warm and dry No muscular weakness   Current Outpatient Prescriptions  Medication Sig Dispense Refill  . ramipril (ALTACE) 5 MG capsule Take 5 mg by mouth daily.          Allergies  Doxycycline  Electrocardiogram:Tele 11/23- 12/13  NSR occsional PAC.  ECG 5/24 SR 77 PVC;s QT 428  Assessment and Plan

## 2010-12-30 NOTE — Assessment & Plan Note (Signed)
Although they are likely benign and related to stress and panic attacks she still complains about them.  After stress echo refer to EPS to see if there is anything to suppress

## 2011-01-06 ENCOUNTER — Telehealth: Payer: Self-pay | Admitting: Internal Medicine

## 2011-01-06 NOTE — Telephone Encounter (Signed)
Spoke with pt, pt is having trouble with tendonitis and is afraid she will not be able to walk for the testing. Per wanda deal pt can be switched to dobutamine if unable to walk when gets here Deliah Goody

## 2011-01-06 NOTE — Telephone Encounter (Signed)
Pt wants to talk you about canceling stress echo

## 2011-01-08 ENCOUNTER — Other Ambulatory Visit (HOSPITAL_COMMUNITY): Payer: PRIVATE HEALTH INSURANCE | Admitting: Radiology

## 2011-01-08 ENCOUNTER — Ambulatory Visit (HOSPITAL_COMMUNITY): Payer: PRIVATE HEALTH INSURANCE | Attending: Cardiovascular Disease | Admitting: Radiology

## 2011-01-08 ENCOUNTER — Ambulatory Visit (HOSPITAL_BASED_OUTPATIENT_CLINIC_OR_DEPARTMENT_OTHER): Payer: PRIVATE HEALTH INSURANCE | Admitting: Radiology

## 2011-01-08 ENCOUNTER — Other Ambulatory Visit (HOSPITAL_COMMUNITY): Payer: Self-pay | Admitting: Cardiovascular Disease

## 2011-01-08 DIAGNOSIS — R002 Palpitations: Secondary | ICD-10-CM | POA: Insufficient documentation

## 2011-01-08 DIAGNOSIS — I10 Essential (primary) hypertension: Secondary | ICD-10-CM | POA: Insufficient documentation

## 2011-01-08 DIAGNOSIS — Z87891 Personal history of nicotine dependence: Secondary | ICD-10-CM | POA: Insufficient documentation

## 2011-01-08 DIAGNOSIS — R072 Precordial pain: Secondary | ICD-10-CM | POA: Insufficient documentation

## 2011-01-08 DIAGNOSIS — I4949 Other premature depolarization: Secondary | ICD-10-CM

## 2011-01-08 DIAGNOSIS — R42 Dizziness and giddiness: Secondary | ICD-10-CM | POA: Insufficient documentation

## 2011-01-08 DIAGNOSIS — R0989 Other specified symptoms and signs involving the circulatory and respiratory systems: Secondary | ICD-10-CM

## 2011-01-08 DIAGNOSIS — R079 Chest pain, unspecified: Secondary | ICD-10-CM

## 2011-01-08 MED ORDER — ATROPINE SULFATE 0.1 MG/ML IJ SOLN
0.2500 mg | Freq: Once | INTRAMUSCULAR | Status: AC
  Administered 2011-01-08: 0.25 mg via INTRAVENOUS

## 2011-01-08 MED ORDER — SODIUM CHLORIDE 0.9 % IV SOLN
40.0000 ug/kg | Freq: Once | INTRAVENOUS | Status: AC
  Administered 2011-01-08: 40 ug/kg/min via INTRAVENOUS

## 2011-02-05 ENCOUNTER — Encounter: Payer: Self-pay | Admitting: Internal Medicine

## 2011-02-05 ENCOUNTER — Ambulatory Visit (INDEPENDENT_AMBULATORY_CARE_PROVIDER_SITE_OTHER): Payer: PRIVATE HEALTH INSURANCE | Admitting: Internal Medicine

## 2011-02-05 DIAGNOSIS — R002 Palpitations: Secondary | ICD-10-CM

## 2011-02-05 DIAGNOSIS — I4949 Other premature depolarization: Secondary | ICD-10-CM

## 2011-02-05 DIAGNOSIS — I428 Other cardiomyopathies: Secondary | ICD-10-CM

## 2011-02-05 DIAGNOSIS — I4891 Unspecified atrial fibrillation: Secondary | ICD-10-CM

## 2011-02-05 MED ORDER — VERAPAMIL HCL ER 120 MG PO TBCR: 120.0000 mg | EXTENDED_RELEASE_TABLET | Freq: Every day | ORAL | Status: DC

## 2011-02-05 NOTE — Patient Instructions (Signed)
Your physician recommends that you schedule a follow-up appointment in: 8 weeks with Dr Ladona Ridgel  Your physician has recommended that you wear a holter monitor. Holter monitors are medical devices that record the heart's electrical activity. Doctors most often use these monitors to diagnose arrhythmias. Arrhythmias are problems with the speed or rhythm of the heartbeat. The monitor is a small, portable device. You can wear one while you do your normal daily activities. This is usually used to diagnose what is causing palpitations/syncope (passing out).    Your physician has recommended you make the following change in your medication: Start Verapamil SR 120 mg daily

## 2011-02-05 NOTE — Assessment & Plan Note (Signed)
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 While they are unlikely to be malignant, it would be important to characterize the quantity of her PVCs as well as whether there are multiple foci or not. I recommended that the patient undergo 24-hour Holter monitoring followed by initiation of low-dose calcium channel blocker therapy. If her PVCs are more frequent than we initially thought, then more aggressive monitoring and follow up will be recommended. Particularly if there are less than 10,000 PVCs in a 24-hour period, I would recommend attempts to treat her symptoms with the calcium channel blocker. Additional therapy would be recommended depending on the density of the PVCs as well as her response to initial therapy. I'll see the patient back in several months, sooner if she has more frequent ventricular ectopy.

## 2011-02-05 NOTE — Progress Notes (Signed)
HPI Lauren [name] is referred today by Dr. Eden Emms for evaluation of palpitations. The patient has a history of a postpartum cardiomyopathy diagnosed in 2002. She had subsequent normalization of her left ventricular dysfunction. She has had intermittent palpitations since then but over the last few months these have increased in frequency and severity. She denies syncope. She underwent stress echo demonstrated normal left ventricular systolic function. The patient notes that every day she has palpitations. These are not necessarily related to exertion. She has never had frank syncope. At times she will feel dizzy and lightheaded and this may last up to 5 seconds. She has been on no medical therapy for this. Allergies  Allergen Reactions  . Doxycycline     REACTION: rash     Current Outpatient Prescriptions  Medication Sig Dispense Refill  . ramipril (ALTACE) 5 MG capsule Take 5 mg by mouth daily.        . verapamil (CALAN-SR) 120 MG CR tablet Take 1 tablet (120 mg total) by mouth daily.  30 tablet  11     Past Medical History  Diagnosis Date  . Palpitations   . Cardiomyopathy     post-partum 2002  . Paresthesia   . Pain of cervical spine   . Right ear pain     ROS:   All systems reviewed and negative except as noted in the HPI.   No past surgical history on file.   No family history on file.   History   Social History  . Marital Status: Married    Spouse Name: N/A    Number of Children: N/A  . Years of Education: N/A   Occupational History  . Not on file.   Social History Main Topics  . Smoking status: Former Games developer  . Smokeless tobacco: Not on file   Comment: started at 16, smoked less than 1 ppd; quit in 1990  . Alcohol Use: 0.0 oz/week    0 drink(s) per week     approximately 3-5 drinks/week   . Drug Use: Not on file  . Sexually Active: Yes -- Female partner(s)   Other Topics Concern  . 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 HEART:  Iregular rate rhythm, no murmurs, no rubs, no clicks Abd:  soft, positive bowel sounds, no organomegally, no rebound, no guarding Ext:  2 plus pulses, no edema, no cyanosis, no clubbing Skin:  No rashes no nodules Neuro:  CN II through XII intact, motor grossly intact  EKG Normal sinus rhythm with frequent PVCs originating from the right ventricular outflow tract. Transition from negative to positive his in lead V3.  Assess/Plan:

## 2011-02-05 NOTE — Assessment & Plan Note (Signed)
With normal left ventricular systolic function by echo, there is no indication that her cardiomyopathy has recurred.

## 2011-02-12 ENCOUNTER — Telehealth: Payer: Self-pay

## 2011-03-31 ENCOUNTER — Other Ambulatory Visit: Payer: Self-pay | Admitting: *Deleted

## 2011-03-31 MED ORDER — RAMIPRIL 5 MG PO CAPS: 5.0000 mg | ORAL_CAPSULE | Freq: Every day | ORAL | Status: DC

## 2011-04-02 ENCOUNTER — Ambulatory Visit: Payer: PRIVATE HEALTH INSURANCE | Admitting: Internal Medicine

## 2011-04-03 NOTE — Telephone Encounter (Signed)
Patient never called back for monitor

## 2011-04-23 ENCOUNTER — Ambulatory Visit: Payer: PRIVATE HEALTH INSURANCE | Admitting: Internal Medicine

## 2011-06-17 ENCOUNTER — Ambulatory Visit (INDEPENDENT_AMBULATORY_CARE_PROVIDER_SITE_OTHER): Payer: PRIVATE HEALTH INSURANCE | Admitting: Internal Medicine

## 2011-06-17 ENCOUNTER — Encounter: Payer: Self-pay | Admitting: Internal Medicine

## 2011-06-17 DIAGNOSIS — R002 Palpitations: Secondary | ICD-10-CM

## 2011-06-17 DIAGNOSIS — I10 Essential (primary) hypertension: Secondary | ICD-10-CM

## 2011-06-17 NOTE — Progress Notes (Signed)
HPI [name] returns today for followup. She is a 46 year old woman with a history of palpitations and documented PVCs. I saw her initially approximately 4 months ago and at that time recommended a trial of verapamil. She took the medicine for several weeks but then her symptoms improved and she stopped the medications. Since then she has done well off of medical therapy. She has had occasional breakthroughs of palpitations but these have been self-limited. She denies chest pain, shortness of breath, or syncope. Allergies  Allergen Reactions  . Doxycycline     REACTION: rash     Current Outpatient Prescriptions  Medication Sig Dispense Refill  . ESTROGENS CONJUGATED PO Take 1 tablet by mouth once a week.      . ramipril (ALTACE) 5 MG capsule Take 1 capsule (5 mg total) by mouth daily.  30 capsule  12  . THYROID PO Take 2 tablets by mouth daily.         Past Medical History  Diagnosis Date  . Palpitations   . Cardiomyopathy     post-partum 2002  . Paresthesia   . Pain of cervical spine   . Right ear pain     ROS:   All systems reviewed and negative except as noted in the HPI.   Past Surgical History  Procedure Date  . Echocardiogram stress test 12/30/10  . Transthoracic echocardiogram 03/11/06     Family History  Problem Relation Age of Onset  . Cancer Maternal Grandmother     reproductive organs  . Cancer Paternal Grandmother      History   Social History  . Marital Status: Married    Spouse Name: N/A    Number of Children: N/A  . Years of Education: N/A   Occupational History  . Not on file.   Social History Main Topics  . Smoking status: Former Games developer  . Smokeless tobacco: Not on file   Comment: started at 16, smoked less than 1 ppd; quit in 1990  . Alcohol Use: 0.0 oz/week    0 drink(s) per week     approximately 3-5 drinks/week   . Drug Use: Not on file  . Sexually Active: Yes -- Female partner(s)   Other Topics Concern  . 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 HEART:  Regular rate rhythm, no murmurs, no rubs, no clicks Abd:  soft, positive bowel sounds, no organomegally, no rebound, no guarding Ext:  2 plus pulses, no edema, no cyanosis, no clubbing Skin:  No rashes no nodules Neuro:  CN II through XII intact, motor grossly intact   Assess/Plan:

## 2011-06-17 NOTE — Patient Instructions (Signed)
Your physician recommends that you schedule a follow-up appointment as needed  

## 2011-06-17 NOTE — Assessment & Plan Note (Signed)
At this point her symptoms appear to be for the most part controlled off of medical therapy. I have recommended watchful waiting. If her symptoms return, she may restart her verapamil.

## 2011-06-17 NOTE — Assessment & Plan Note (Signed)
Her blood pressure appears to be well-controlled. She will maintain a low-sodium diet and continue her current medical therapy. 

## 2012-05-03 ENCOUNTER — Other Ambulatory Visit: Payer: Self-pay | Admitting: *Deleted

## 2012-05-03 MED ORDER — RAMIPRIL 5 MG PO CAPS: 5.0000 mg | ORAL_CAPSULE | Freq: Every day | ORAL | Status: DC

## 2012-05-04 ENCOUNTER — Other Ambulatory Visit: Payer: Self-pay | Admitting: *Deleted

## 2012-05-04 MED ORDER — RAMIPRIL 5 MG PO CAPS: 5.0000 mg | ORAL_CAPSULE | Freq: Every day | ORAL | Status: DC

## 2012-11-22 ENCOUNTER — Other Ambulatory Visit: Payer: Self-pay | Admitting: Neurology

## 2012-11-23 ENCOUNTER — Telehealth: Payer: Self-pay | Admitting: *Deleted

## 2012-11-23 NOTE — Telephone Encounter (Signed)
I called and left a message for the patient to callback to the office to r/s appt.

## 2013-01-03 ENCOUNTER — Telehealth: Payer: Self-pay | Admitting: Neurology

## 2013-01-04 ENCOUNTER — Other Ambulatory Visit: Payer: Self-pay | Admitting: Obstetrics and Gynecology

## 2013-01-04 DIAGNOSIS — R928 Other abnormal and inconclusive findings on diagnostic imaging of breast: Secondary | ICD-10-CM

## 2013-01-04 NOTE — Telephone Encounter (Signed)
Spoke to patient and she complains of progressively worse symptoms of her neuropathy.  She said there is twitching on the outer part of her feet and palm, and many other things going on.  She was last seen 07-16-12 and was suppose to have a follow upon 11-26-12 but office rescheduled for Jan 2015, she does not want to wait that long.  [phone]

## 2013-01-04 NOTE — Telephone Encounter (Signed)
I called the patient. The patient is having new sensory symptoms. All symptoms are subjective. The patient was having left-sided symptoms, now spreading to the right. We will try to work in sometime in the next month.

## 2013-01-05 ENCOUNTER — Encounter: Payer: Self-pay | Admitting: Neurology

## 2013-01-05 ENCOUNTER — Ambulatory Visit (INDEPENDENT_AMBULATORY_CARE_PROVIDER_SITE_OTHER): Payer: Managed Care, Other (non HMO) | Admitting: Neurology

## 2013-01-05 VITALS — BP 136/77 | HR 58 | Ht 64.0 in | Wt 115.0 lb

## 2013-01-05 DIAGNOSIS — R209 Unspecified disturbances of skin sensation: Secondary | ICD-10-CM

## 2013-01-05 MED ORDER — TOPIRAMATE 25 MG PO TABS: ORAL_TABLET | ORAL | Status: DC

## 2013-01-05 NOTE — Progress Notes (Signed)
Reason for visit: Numbness  Lauren [name] is an 47 y.o. 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 The patient indicates that her husband recently has been diagnosed with lung cancer. The patient has been under increased stress. The patient indicates that she will have tingling sensations on her feet and hands that come and go. The patient may have vibration sensations on the legs and thighs and she will have some muscle twitches that affect her hands and feet. The patient denies any overt pain or weakness. The patient denies any balance issues or problems controlling the bowels or the bladder. 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 The patient is on propranolol taking 20 mg twice daily. The patient is tolerating this medication well, and she takes Treximet when the headache comes on. Occasionally, she will have vertigo. The patient denies any visual field changes. The patient comes to this office for an evaluation.  Past Medical History  Diagnosis Date  . Palpitations   . Cardiomyopathy     post-partum 2002  . Paresthesia   . Pain of cervical spine   . Right ear pain   . 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 Dizziness   . Menstrual migraine   . History of cardiomyopathy   . Iron deficiency anemia     Past Surgical History  Procedure Laterality Date  . Echocardiogram stress test  12/30/10  . Transthoracic echocardiogram  03/11/06    Family History  Problem Relation Age of Onset  . Cancer Maternal Grandmother     reproductive organs  . Cancer Paternal Grandmother   . Hypertension  Father     Social history:  reports that she has quit smoking. She does not have any smokeless tobacco history on file. She reports that  drinks alcohol. She reports that she does not use illicit drugs.  Allergies:  Allergies  Allergen Reactions  . Doxycycline     REACTION: rash    Medications:  No current outpatient prescriptions on file prior to visit.   No current facility-administered medications on file prior to visit.    ROS:  Out of a complete 14 system review of symptoms, the patient complains only of the following symptoms, and all other reviewed systems are negative.  Dizziness Blurred vision feeling hot, flushing Muscle spasms Headache, numbness, weakness Anxiety, not enough sleep, decreased energy  Blood pressure 136/77, pulse 58, height 5\' 4"  (1.626 m), weight 115 lb (52.164 kg).  Physical Exam  General: The patient is alert and cooperative at the time of the examination.  Skin: No significant peripheral edema is noted.   Neurologic Exam  Cranial nerves: Facial symmetry is present. Speech is normal, no aphasia or dysarthria is noted. Extraocular movements are full. Visual fields are full.  Motor: The patient has good strength in all 4 extremities.  Sensory: Pinprick, soft touch, and vibration sensation are symmetric throughout.  Coordination: The patient has good finger-nose-finger and heel-to-shin bilaterally.  Gait and station: The patient has a normal gait. Tandem gait is normal. Romberg is negative. No drift is seen.  Reflexes: Deep tendon reflexes are symmetric.  Assessment/Plan:  1. Migratory sensory alteration  2. Menstrual migraine  The patient will be switched from propranolol to Topamax. The patient will continue the Treximet. MRI evaluation of the cervical spine will be done. The tortuosity of the basilar artery with indentation of the brainstem could potentially be the source of her sensory complaints. The patient followup in 6  months.  Marlan Palau MD 01/05/2013 7:46 PM  [facility] [address] [place], [place] [address]  Phone [phone] Fax [phone]

## 2013-01-06 ENCOUNTER — Telehealth: Payer: Self-pay | Admitting: Neurology

## 2013-01-10 ENCOUNTER — Telehealth: Payer: Self-pay | Admitting: Neurology

## 2013-01-10 ENCOUNTER — Ambulatory Visit
Admission: RE | Admit: 2013-01-10 | Discharge: 2013-01-10 | Disposition: A | Discharge status: Home / Self Care | Payer: Managed Care, Other (non HMO) | Source: Ambulatory Visit | Attending: Obstetrics and Gynecology | Admitting: Obstetrics and Gynecology

## 2013-01-10 DIAGNOSIS — R928 Other abnormal and inconclusive findings on diagnostic imaging of breast: Secondary | ICD-10-CM

## 2013-01-10 NOTE — Telephone Encounter (Signed)
Called patient. Left vmail.

## 2013-01-11 MED ORDER — PROPRANOLOL HCL 40 MG PO TABS: 40.0000 mg | ORAL_TABLET | Freq: Two times a day (BID) | ORAL | Status: DC

## 2013-01-11 NOTE — Telephone Encounter (Signed)
I called patient. The patient does not wish to go on Topamax. We will try switching her to propranolol, taking 20 mg the morning and 40 mg in the evening for 2 weeks, and then go to 40 mg twice daily. I'll call in a prescription for the 40 mg tablets.

## 2013-01-11 NOTE — Telephone Encounter (Signed)
Patient says in her last OV she said she would like to try Topamax. She now does not want to try Topamax, but would rather increase beta blocker.

## 2013-01-14 ENCOUNTER — Other Ambulatory Visit: Payer: PRIVATE HEALTH INSURANCE

## 2013-01-17 ENCOUNTER — Telehealth: Payer: Self-pay | Admitting: Neurology

## 2013-01-17 NOTE — Telephone Encounter (Signed)
I got a call from the primary care physician today. The patient is having increased problems with anxiety. The patient is on 10 mg twice daily of propranolol, and her systolic blood pressure is 102, and her heart rate is in the 50s. Do not feel go higher on this dose. The patient will be potentially placed on Effexor or clonazepam for her anxiety.

## 2013-01-19 ENCOUNTER — Other Ambulatory Visit: Payer: Self-pay | Admitting: Neurology

## 2013-01-19 DIAGNOSIS — R209 Unspecified disturbances of skin sensation: Secondary | ICD-10-CM

## 2013-01-19 NOTE — Progress Notes (Signed)
MRI of the cervical spine was approved as a with and without contrast study. I will need to put another order in for this study, as the prior order was for without only.

## 2013-01-20 ENCOUNTER — Ambulatory Visit (INDEPENDENT_AMBULATORY_CARE_PROVIDER_SITE_OTHER): Payer: Managed Care, Other (non HMO)

## 2013-01-20 DIAGNOSIS — R209 Unspecified disturbances of skin sensation: Secondary | ICD-10-CM

## 2013-01-21 ENCOUNTER — Telehealth: Payer: Self-pay | Admitting: Neurology

## 2013-01-21 MED ORDER — GADOPENTETATE DIMEGLUMINE 469.01 MG/ML IV SOLN: 10.0000 mL | Freq: Once | INTRAVENOUS | Status: AC | PRN

## 2013-01-21 NOTE — Telephone Encounter (Signed)
I called the patient. The cervical spine MRI does show some degenerative disc disease, no evidence of demyelinating disease in the spinal cord or compression of the spinal cord.

## 2013-02-10 ENCOUNTER — Telehealth: Payer: Self-pay | Admitting: Cardiovascular Disease

## 2013-02-10 NOTE — Telephone Encounter (Signed)
LMTCB

## 2013-02-10 NOTE — Telephone Encounter (Signed)
New Problem  Pt recently stoped taking her altace asks if this is a problem going forward// wants to know if she is due for an echo as well.

## 2013-02-11 NOTE — Telephone Encounter (Signed)
EF is normal so long as her BP ok at home no need for altace

## 2013-02-11 NOTE — Telephone Encounter (Signed)
LMTCB ./CY 

## 2013-02-16 NOTE — Telephone Encounter (Signed)
PT  NOTIFIED ./CY 

## 2013-05-02 ENCOUNTER — Other Ambulatory Visit: Payer: Self-pay | Admitting: Obstetrics and Gynecology

## 2013-05-02 DIAGNOSIS — N6001 Solitary cyst of right breast: Secondary | ICD-10-CM

## 2013-05-05 ENCOUNTER — Ambulatory Visit
Admission: RE | Admit: 2013-05-05 | Discharge: 2013-05-05 | Disposition: A | Discharge status: Home / Self Care | Payer: Commercial Indemnity | Source: Ambulatory Visit | Attending: Obstetrics and Gynecology | Admitting: Obstetrics and Gynecology

## 2013-05-05 DIAGNOSIS — N6001 Solitary cyst of right breast: Secondary | ICD-10-CM

## 2013-05-17 ENCOUNTER — Telehealth: Payer: Self-pay | Admitting: Neurology

## 2013-05-17 MED ORDER — RIZATRIPTAN BENZOATE 10 MG PO TBDP: 10.0000 mg | ORAL_TABLET | Freq: Three times a day (TID) | ORAL | Status: DC | PRN

## 2013-05-17 NOTE — Telephone Encounter (Signed)
I called patient. The patient is having some side effects on Treximet. I will try Maxalt. The patient is concerned about the potential for the serotonin syndrome with the use of Prozac. I do not think that this is clinically significant.

## 2013-06-09 ENCOUNTER — Ambulatory Visit: Payer: Self-pay | Admitting: Neurology

## 2013-07-08 ENCOUNTER — Ambulatory Visit: Payer: Managed Care, Other (non HMO) | Admitting: Nurse Practitioner

## 2013-10-10 ENCOUNTER — Encounter: Payer: Self-pay | Admitting: Cardiovascular Disease

## 2013-10-26 ENCOUNTER — Encounter: Payer: Self-pay | Admitting: Cardiovascular Disease

## 2013-10-27 ENCOUNTER — Encounter: Payer: Self-pay | Admitting: Adult Health

## 2013-10-27 ENCOUNTER — Encounter (INDEPENDENT_AMBULATORY_CARE_PROVIDER_SITE_OTHER): Payer: Self-pay

## 2013-10-27 ENCOUNTER — Ambulatory Visit (INDEPENDENT_AMBULATORY_CARE_PROVIDER_SITE_OTHER): Payer: Managed Care, Other (non HMO) | Admitting: Adult Health

## 2013-10-27 VITALS — BP 124/60 | HR 90 | Ht 65.0 in | Wt 118.0 lb

## 2013-10-27 DIAGNOSIS — G43909 Migraine, unspecified, not intractable, without status migrainosus: Secondary | ICD-10-CM

## 2013-10-27 DIAGNOSIS — R209 Unspecified disturbances of skin sensation: Secondary | ICD-10-CM

## 2013-10-27 MED ORDER — TOPIRAMATE 25 MG PO TABS: ORAL_TABLET | ORAL | Status: DC

## 2013-10-27 NOTE — Patient Instructions (Signed)
Recurrent Migraine Headache A migraine headache is very bad, throbbing pain on one or both sides of your head. Recurrent migraines keep coming back. Talk to your doctor about what things may bring on (trigger) your migraine headaches. HOME CARE  Only take medicines as told by your doctor.  Lie down in a dark, quiet room when you have a migraine.  Keep a journal to find out if certain things bring on migraine headaches. For example, write down:  What you eat and drink.  How much sleep you get.  Any change to your diet or medicines.  Lessen how much alcohol you drink.  Quit smoking if you smoke.  Get enough sleep.  Lessen any stress in your life.  Keep lights dim if bright lights bother you or make your migraines worse. GET HELP IF:  Medicine does not help your migraines.  Your pain keeps coming back. GET HELP RIGHT AWAY IF:   Your migraine becomes really bad.  You have a fever.  You have a stiff neck.  You have trouble seeing.  Your muscles are weak, or you lose muscle control.  You lose your balance or have trouble walking.  You feel like you will pass out (faint), or you pass out.  You have really bad symptoms that are different than your first symptoms. MAKE SURE YOU:   Understand these instructions.  Will watch your condition.  Will get help right away if you are not doing well or get worse. Document Released: 02/26/2008 Document Revised: 03/09/2013 Document Reviewed: 01/24/2013 [facility] Patient Information 2014 [place], [place].

## 2013-10-27 NOTE — Progress Notes (Signed)
PATIENT: [name] DOB: 1965/09/07  REASON FOR VISIT: follow up HISTORY FROM: patient  HISTORY OF PRESENT ILLNESS: Ms. 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 She returns today for follow-up. Patient was started on Effexor and klonopin and that has helped her anxiety. The patient started taking magnesium and states that since then she only has about 7 days out of the month where she will have sensory alterations. Those consist of vibration sensation in the ankles, some numbness over knees, and tightening of muscles in the lower legs. Patient is no longer taking the propanolol- she is unsure why she stopped. She was given a prescription for Topamax but never started because of the potential side effects. The patient currently has approximately 16 headaches a month however they are dull headaches. She uses the treximet frequently to prevent severe migraines. She is using up her medication (treximet) rapidly due to the amount of headaches she has. Patient states that noise and light bothers her but denies nausea or vomiting. Patient has a history of headaches since she was 48 years old. No new medical issues since the last visit.   REVIEW OF SYSTEMS: Full 14 system review of systems performed and notable only for:  Constitutional: N/A  Eyes: N/A Ear/Nose/Throat: N/A  Skin: N/A  Cardiovascular: N/A  Respiratory: N/A  Gastrointestinal: N/A  Genitourinary: N/A Hematology/Lymphatic: N/A  Endocrine: N/A Musculoskeletal: neck stiffness Allergy/Immunology: N/A  Neurological: dizziness, headache, tremors Psychiatric: N/A Sleep: N/A   ALLERGIES: Allergies  Allergen Reactions  . Doxycycline     REACTION: rash    HOME MEDICATIONS: Outpatient Prescriptions Prior to Visit  Medication Sig Dispense Refill  . propranolol (INDERAL) 40 MG tablet Take 1 tablet (40 mg total) by mouth 2 (two) times  daily.  60 tablet  3  . rizatriptan (MAXALT-MLT) 10 MG disintegrating tablet Take 1 tablet (10 mg total) by mouth 3 (three) times daily as needed for migraine. May repeat in 2 hours if needed  9 tablet  3   No facility-administered medications prior to visit.    PAST MEDICAL HISTORY: Past Medical History  Diagnosis Date  . Palpitations   . Cardiomyopathy     post-partum 2002  . Paresthesia   . Pain of cervical spine   . Right ear pain   . 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 Dizziness   . Menstrual migraine   . History of cardiomyopathy   . Iron deficiency anemia     PAST SURGICAL HISTORY: Past Surgical History  Procedure Laterality Date  . Echocardiogram stress test  12/30/10  . Transthoracic echocardiogram  03/11/06    FAMILY HISTORY: Family History  Problem Relation Age of Onset  . Cancer Maternal Grandmother     reproductive organs  . Cancer Paternal Grandmother   . Hypertension Father     SOCIAL HISTORY: History   Social History  . Marital Status: Married    Spouse Name: Lauren [name]    Number of Children: 2  . Years of Education: 14   Occupational History  . Physical therapy assistant   .     Social History Main Topics  . Smoking status: Former Games developer  . Smokeless tobacco: Never Used     Comment: started at 16, smoked less than 1 ppd; quit in 1990  . Alcohol Use: 0.0 oz/week    0 drink(s) per week  Comment: approximately 3-5 drinks/week   . Drug Use: No  . Sexual Activity: Yes    Partners: Male   Other Topics Concern  . Not on file   Social History Narrative   Patient is Married (Lauren [name]) and lives at home with her family.   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   Patient is left-handed.   Patient drinks one cup of coffee and two cups per day.      PHYSICAL EXAM  Filed Vitals:   10/27/13 0919  BP: 124/60  Pulse: 90  Height: 5\' 5"  (1.651  m)  Weight: 118 lb (53.524 kg)   Body mass index is 19.64 kg/(m^2).  Generalized: Well developed, in no acute distress   Neurological examination  Mentation: Alert oriented to time, place, history taking. Follows all commands speech and language fluent Cranial nerve II-XII: Extraocular movements were full, visual field were full on confrontational test.  Motor: The motor testing reveals 5 over 5 strength of all 4 extremities. Good symmetric motor tone is noted throughout.  Sensory: Sensory testing is intact to soft touch on all 4 extremities. No evidence of extinction is noted.  Coordination: Cerebellar testing reveals good finger-nose-finger and heel-to-shin bilaterally.  Gait and station: Gait is normal. Tandem gait is normal. Romberg is negative. No drift is seen.  Reflexes: Deep tendon reflexes are symmetric and normal bilaterally.    DIAGNOSTIC DATA (LABS, IMAGING, TESTING) - I reviewed patient records, labs, notes, testing and imaging myself where available.  Lab Results  Component Value Date   HGB 13.6 10/24/2008   HCT 40.0 10/24/2008      Component Value Date/Time   NA 140 10/24/2008 1436   K 3.7 10/24/2008 1436   CL 108 10/24/2008 1436   GLUCOSE 98 10/24/2008 1436   BUN 14 10/24/2008 1436   CREATININE 0.7 10/24/2008 1436    ASSESSMENT AND PLAN 48 y.o. 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 here with;  1. Disturbance of skin sensation 2. Migraines  Patient reports that sensory alterations have improved. However she is having approximately 16 headaches a month. She uses the treximet and that prevents then from developing into a severe migraine headache. In the past the patient was on propanolol and she stopped that for unknown reason. She was  prescribed Topamax but did not take it after reading about the side effects. I spoke to the patient about starting preventative medication for her headaches. She is open to trying the Topamax. She will start Topamax 25 mg 1 tablet daily at night for 1 week then increase to 2 tablets daily at night. She was advise to let us know if she developed side effects and at that time we could adjust her medication. If patient is unsuccessful with oral medications for migraines she may be a candidate for Botox injections. Patient should follow-up in 3-4 months or sooner if needed.    Lauren Penny, MSN, NP-C 10/27/2013, 9:25 AM [facility] [address] [place], [place] [address] [phone]  Note: This document was prepared with digital dictation and possible smart phrase technology. Any transcriptional errors that result from this process are unintentional.

## 2013-10-27 NOTE — Progress Notes (Signed)
I have read the note, and I agree with the clinical assessment and plan.  Pradyun Ishman K Bela Nyborg   

## 2013-12-27 ENCOUNTER — Telehealth: Payer: Self-pay | Admitting: *Deleted

## 2013-12-27 NOTE — Telephone Encounter (Signed)
I called patient. I left a message. The patient is wanting more Treximet, but I am concerned that she may be taking more than she should. The patient may be taking as much is 20 tablets a month. The patient is only on 50 mg daily of the Topamax, likely she go on the dose unless she cannot tolerate this. She has an appointment at the end of September 2015, I'll try to get an earlier appointment to help manage her medications.

## 2013-12-27 NOTE — Telephone Encounter (Signed)
It does not appear we have prescribed Treximet for this patient.  I called back.  The patient said she was getting the Rx through another office.  Says she has about 10 headache days per month, and takes one Treximet every 12 hours on each of those days.  (Her friend gives her the extra tablets).  Says she has been taking Topamax for 2 months, and does not feel like it has been beneficial.  She does not wish to have the Topamax dose increased.  Patient would like to know if we will prescribe regular Sumatriptan for her to take for migraines, or provide suggestions as to what she can do.  Please advise.  Thank you.

## 2014-01-09 ENCOUNTER — Ambulatory Visit (INDEPENDENT_AMBULATORY_CARE_PROVIDER_SITE_OTHER): Payer: Managed Care, Other (non HMO) | Admitting: Adult Health

## 2014-01-09 ENCOUNTER — Encounter: Payer: Self-pay | Admitting: Adult Health

## 2014-01-09 VITALS — BP 139/91 | HR 62 | Ht 65.0 in | Wt 119.0 lb

## 2014-01-09 DIAGNOSIS — G43829 Menstrual migraine, not intractable, without status migrainosus: Secondary | ICD-10-CM | POA: Insufficient documentation

## 2014-01-09 DIAGNOSIS — R209 Unspecified disturbances of skin sensation: Secondary | ICD-10-CM

## 2014-01-09 NOTE — Patient Instructions (Signed)

## 2014-01-09 NOTE — Progress Notes (Signed)
PATIENT: Lauren [name] DOB: 10/23/1965  REASON FOR VISIT: follow up HISTORY FROM: patient  HISTORY OF PRESENT ILLNESS Lauren [name] is a 48 year old female with a history of sensory alteration in her arms and legs and migraines. She currently takes treximet and Topamax for her headaches. She states that she has been having some side effects since taking the Topamax. She has aching feeling her hands and feet. At this time she no longer wants to take the Topamax nor any other medication. Patient's headache coincide with her menstrual cycle. She believes that she is premenopausal. Her menstrual cycle is not regular therefore she can have headaches throughout the month. She has an appointment with her OBGYN in the next few months.    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 She returns today for follow-up. Patient was started on Effexor and klonopin and that has helped her anxiety. The patient started taking magnesium and states that since then she only has about 7 days out of the month where she will have sensory alterations. Those consist of vibration sensation in the ankles, some numbness over knees, and tightening of muscles in the lower legs. Patient is no longer taking the propanolol- she is unsure why she stopped. She was given a prescription for Topamax but never started because of the potential side effects. The patient currently has approximately 16 headaches a month however they are dull headaches. She uses the treximet frequently to prevent severe migraines. She is using up her medication (treximet) rapidly due to the amount of headaches she has. Patient states that noise and light bothers her but denies nausea or vomiting. Patient has a history of headaches since she was 48 years old. No new medical issues since the last visit.    REVIEW OF SYSTEMS: Full 14 system review of systems performed and notable only for:  Constitutional: N/A    Eyes: N/A Ear/Nose/Throat: N/A  Skin: N/A  Cardiovascular: N/A  Respiratory: N/A  Gastrointestinal: Diarrhea  Genitourinary: N/A Hematology/Lymphatic: N/A  Endocrine: N/A Musculoskeletal: joint pain and back pain  Allergy/Immunology: N/A  Neurological: dizziness Psychiatric: N/A Sleep: insomnia   ALLERGIES: Allergies  Allergen Reactions  . Doxycycline     REACTION: rash    HOME MEDICATIONS: Outpatient Prescriptions Prior to Visit  Medication Sig Dispense Refill  . amoxicillin (AMOXIL) 500 MG capsule As needed for dental      . clonazePAM (KLONOPIN) 0.5 MG tablet 1 tablet daily.      . Magnesium 200 MG TABS Take 1 tablet by mouth 2 (two) times daily.      . Multiple Vitamins-Minerals (MULTIVITAMIN PO) Take 1 tablet by mouth daily.      Marland Kitchen TREXIMET 85-500 MG per tablet Take 1 tablet by mouth as needed.       . venlafaxine XR (EFFEXOR-XR) 75 MG 24 hr capsule 1 capsule daily.      Marland Kitchen topiramate (TOPAMAX) 25 MG tablet Take 1 tablet daily at bedtime for 1 week then increase to 2 tablets daily at bedtime  60 tablet  3  . amoxicillin-clavulanate (AUGMENTIN) 875-125 MG per tablet        No facility-administered medications prior to visit.    PAST MEDICAL HISTORY: Past Medical History  Diagnosis Date  . Palpitations   . Cardiomyopathy     post-partum 2002  . Paresthesia   . Pain of cervical spine   . Right ear pain   . Alteration in sensory  perception as evidenced by illusions   . Dizziness   . Menstrual migraine   . History of cardiomyopathy   . Iron deficiency anemia     PAST SURGICAL HISTORY: Past Surgical History  Procedure Laterality Date  . Echocardiogram stress test  12/30/10  . Transthoracic echocardiogram  03/11/06    FAMILY HISTORY: Family History  Problem Relation Age of Onset  . Cancer Maternal Grandmother     reproductive organs  . Cancer Paternal Grandmother   . Hypertension Father     SOCIAL HISTORY: History   Social History  . Marital  Status: Married    Spouse Name: [name]    Number of Children: 2  . Years of Education: 14   Occupational History  . Physical therapy assistant   .     Social History Main Topics  . Smoking status: Former Games developer  . Smokeless tobacco: Never Used     Comment: started at 16, smoked less than 1 ppd; quit in 1990  . Alcohol Use: 0.0 oz/week    0 drink(s) per week     Comment: approximately 3-5 drinks/week   . Drug Use: No  . Sexual Activity: Yes    Partners: Male   Other Topics Concern  . Not on file   Social History Narrative   Patient is Married ([name]) and lives at home with her family.   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   Patient is left-handed.   Patient drinks one cup of coffee and two cups per day.      PHYSICAL EXAM  Filed Vitals:   01/09/14 0908  BP: 139/91  Pulse: 62  Height: 5\' 5"  (1.651 m)  Weight: 119 lb (53.978 kg)   Body mass index is 19.8 kg/(m^2).  Generalized: Well developed, in no acute distress   Neurological examination  Mentation: Alert oriented to time, place, history taking. Follows all commands speech and language fluent Cranial nerve II-XII: Pupils were equal round reactive to light. Extraocular movements were full, visual field were full on confrontational test.  Motor: The motor testing reveals 5 over 5 strength of all 4 extremities. Good symmetric motor tone is noted throughout.  Sensory: Sensory testing is intact to soft touch on all 4 extremities. No evidence of extinction is noted.  Coordination: Cerebellar testing reveals good finger-nose-finger and heel-to-shin bilaterally.  Gait and station: Gait is normal. Tandem gait is normal. Romberg is negative. No drift is seen.  Reflexes: Deep tendon reflexes are symmetric and normal bilaterally.    DIAGNOSTIC DATA (LABS, IMAGING, TESTING) - I reviewed patient records, labs, notes, testing and imaging myself  where available.  Lab Results  Component Value Date   HGB 13.6 10/24/2008   HCT 40.0 10/24/2008      Component Value Date/Time   NA 140 10/24/2008 1436   K 3.7 10/24/2008 1436   CL 108 10/24/2008 1436   GLUCOSE 98 10/24/2008 1436   BUN 14 10/24/2008 1436   CREATININE 0.7 10/24/2008 1436       ASSESSMENT AND PLAN 48 y.o. 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 here with:  1. migraines related to menstrual cycle 2. Intermittent Sensory alterations and arms and legs  Patient states that she is  not happy being on Topamax. She feels that it has some unwanted side effects. Patient is currently only taking 25 mg once a day. She can stop this medication. Patient feels that she is premenopausal and her headaches coincide with her menstrual cycle. At this time I believe that her headaches would be better controlled if she was on birth control to help manage her menstrual cycles. Patient agrees. She will followup with her OB/GYN and perhaps start a birth control Pill. Patient sensory alterations in her arms and legs are very random now. She states that since she started taking magnesium twice a day this has significantly decreased. She should continue taking magnesium twice a day. Patient should followup in 6 months or sooner if needed.  Butch Penny, MSN, NP-C 01/09/2014, 9:14 AM [facility] [address], [address] [place], [place] [address] [phone]  Note: This document was prepared with digital dictation and possible smart phrase technology. Any transcriptional errors that result from this process are unintentional.

## 2014-01-09 NOTE — Addendum Note (Signed)
Addended by: Enedina FinnerMILLIKAN, Auri Jahnke P on: 01/09/2014 11:29 AM   Modules accepted: Orders, Medications

## 2014-02-28 ENCOUNTER — Ambulatory Visit: Payer: Managed Care, Other (non HMO) | Admitting: Adult Health

## 2014-03-23 ENCOUNTER — Other Ambulatory Visit: Payer: Self-pay | Admitting: Endocrinology

## 2014-03-23 DIAGNOSIS — E049 Nontoxic goiter, unspecified: Secondary | ICD-10-CM

## 2014-04-11 ENCOUNTER — Other Ambulatory Visit: Payer: Commercial Indemnity

## 2014-06-29 ENCOUNTER — Ambulatory Visit
Admission: RE | Admit: 2014-06-29 | Discharge: 2014-06-29 | Disposition: A | Discharge status: Home / Self Care | Payer: Commercial Indemnity | Source: Ambulatory Visit | Attending: Endocrinology | Admitting: Endocrinology

## 2014-06-29 DIAGNOSIS — E049 Nontoxic goiter, unspecified: Secondary | ICD-10-CM

## 2014-07-10 ENCOUNTER — Other Ambulatory Visit: Payer: Self-pay | Admitting: Physician Assistant

## 2014-07-10 DIAGNOSIS — R0989 Other specified symptoms and signs involving the circulatory and respiratory systems: Secondary | ICD-10-CM

## 2014-07-10 DIAGNOSIS — R4702 Dysphasia: Secondary | ICD-10-CM

## 2014-07-11 ENCOUNTER — Ambulatory Visit
Admission: RE | Admit: 2014-07-11 | Discharge: 2014-07-11 | Disposition: A | Discharge status: Home / Self Care | Payer: Managed Care, Other (non HMO) | Source: Ambulatory Visit | Attending: Physician Assistant | Admitting: Physician Assistant

## 2014-07-11 DIAGNOSIS — R4702 Dysphasia: Secondary | ICD-10-CM

## 2014-07-11 DIAGNOSIS — R0989 Other specified symptoms and signs involving the circulatory and respiratory systems: Secondary | ICD-10-CM

## 2014-07-12 ENCOUNTER — Ambulatory Visit: Payer: Managed Care, Other (non HMO) | Admitting: Adult Health

## 2014-08-02 ENCOUNTER — Ambulatory Visit: Payer: Managed Care, Other (non HMO) | Admitting: Adult Health

## 2014-09-05 ENCOUNTER — Ambulatory Visit: Payer: Managed Care, Other (non HMO) | Admitting: Adult Health

## 2015-05-22 ENCOUNTER — Ambulatory Visit: Payer: Managed Care, Other (non HMO) | Attending: Orthopedic Surgery

## 2015-05-22 DIAGNOSIS — M5441 Lumbago with sciatica, right side: Secondary | ICD-10-CM | POA: Insufficient documentation

## 2015-05-22 DIAGNOSIS — M25512 Pain in left shoulder: Secondary | ICD-10-CM | POA: Insufficient documentation

## 2015-05-22 DIAGNOSIS — R29898 Other symptoms and signs involving the musculoskeletal system: Secondary | ICD-10-CM

## 2015-05-22 DIAGNOSIS — M6281 Muscle weakness (generalized): Secondary | ICD-10-CM | POA: Insufficient documentation

## 2015-05-22 NOTE — Patient Instructions (Signed)
Piriformis (Supine)  Cross legs, right on top. Gently pull other knee toward chest until stretch is felt in buttock/hip of top leg. Hold ____ seconds. Repeat ____ times per set. Do ____ sets per session. Do ____ sessions per day.     Stretching: Piriformis (Supine)  Pull right knee toward opposite shoulder. Hold ____ seconds. Relax. Repeat ____ times per set. Do ____ sets per session. Do ____ sessions per day.   Resisted Horizontal Abduction: Bilateral   Sit or stand, tubing in both hands, arms out in front. Keeping arms straight, pinch shoulder blades together and stretch arms out. Repeat _10___ times per set. Do 2____ sets per session. Do _1-2___ sessions per day.  Bridge    Lie back, legs bent. Inhale, pressing hips up. Keeping ribs in, lengthen lower back. Exhale, rolling down along spine from top.  Hold 5 seconds Repeat _10___ times. Do __2__ sessions per day.  http://pm.exer.us/55   Copyright  VHI. All rights reserved.    [facility] [address], [address] [place], [place] [address] Phone # [phone] Fax [phone]

## 2015-05-22 NOTE — Therapy (Signed)
[facility] [address], [address] [place], [place], [address] Phone: [phone]   Fax:  [phone]  Physical Therapy Evaluation  Patient Details  Name: Lauren [name] MRN: [id number] Date of Birth: 06-Mar-1966 Referring Provider: Anette Riedel, [place]  Encounter Date: 05/22/2015      PT End of Session - 05/22/15 1520    Visit Number 1   Date for PT Re-Evaluation 07/17/15   PT Start Time 1430   PT Stop Time 1514   PT Time Calculation (min) 44 min   Activity Tolerance Patient tolerated treatment well   Behavior During Therapy [facility] for tasks assessed/performed      Past Medical History  Diagnosis Date  . Palpitations   . Cardiomyopathy     post-partum 2002  . Paresthesia   . Pain of cervical spine   . Right ear pain   . 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 Dizziness   . Menstrual migraine   . History of cardiomyopathy   . Iron deficiency anemia     Past Surgical History  Procedure Laterality Date  . Echocardiogram stress test  12/30/10  . Transthoracic echocardiogram  03/11/06    There were no vitals filed for this visit.  Visit Diagnosis:  Right-sided low back pain with right-sided sciatica - Plan: PT plan of care cert/re-cert  Pain in joint of left shoulder - Plan: PT plan of care cert/re-cert  Weakness of back - Plan: PT plan of care cert/re-cert      Subjective Assessment - 05/22/15 1434    Subjective Pt is a 49 y.o.  Lt hand dominant female who presents to PT with complaints of LBP that began 6 months ago without incident or injury.  Pt also reports Lt shoulder pain that began 3 months ago witout cause.  Pt reports that MRI showed possible posterior labral tear and she will see a specialist soon .  PT will evaluate Lt shoulder but hold treatment until she sees MD per her request.     Diagnostic tests MRI of shoulder: possible posterior labral tear.  MRI of lumbar spine: moderate  DDD, no significant disc issues per pt report.     Patient Stated Goals reduce LBP, reduce shoulder pain    Currently in Pain? Yes   Pain Score 4    Pain Location Back   Pain Orientation Right;Lower   Pain Descriptors / Indicators Burning;Sharp   Pain Type Chronic pain   Pain Radiating Towards Rt hip/gluteals   Pain Onset More than a month ago   Pain Frequency Intermittent   Aggravating Factors  positional: twising, bending, coming to stand after sitting too long   Pain Relieving Factors stopping the aggravating motion, Aleve, rolling on hard foam roll.   Multiple Pain Sites Yes   Pain Score 6   Pain Location Shoulder   Pain Orientation Left   Pain Descriptors / Indicators Sharp;Aching;Dull   Pain Type Chronic pain   Pain Onset More than a month ago   Pain Frequency Constant   Aggravating Factors  lifting, shoulder abduction, reaching across the body   Pain Relieving Factors not performing aggravating activity            [facility] PT Assessment - 05/22/15 0001    Assessment   Medical Diagnosis chronic Rt sided low back pain with Rt sciatica (M54.41), Pain of left upper extremity (M79.602), acute pain of Lt shoulder (M25.512)   Referring Provider Anette Riedel, [place]  PT LONG TERM GOAL #2   Title reduce FOTO to < or = to 36% limitation   Time 8   Period Weeks   Status New   PT LONG TERM GOAL #3   Title report 60% reduction in LBP with home and work tasks   Time 8   Period Weeks   Status New   PT LONG TERM GOAL #4   Title verbalize and demonstrate progression of core stabilization exercises    Time 8   Period Weeks   Status New   PT LONG TERM GOAL #5   Title report a 60% reduction in Lt shoulder pain with reaching out or across the body   Time 8   Period Weeks   Status New               Plan - 05/22/15 1521    Clinical Impression Statement Pt presents to PT with complaints of 6 month history of Rt sided lumbar/SI joint pain without cause and 3 month history of Lt shoulder pain without injury. Pt stopped all regular exercise due to pain.  Pt demonstrates painul Lt shoulder motion against gravity, weakness in the posterior shoulder/scapula, core weakness and tension in bilateral lumbar paraspinals.  FOTO score is 43% limitation.  Pt will benefit from progression of core stabilization and hip/lumbar flexibility, body mechanics education and manual/modalities/strength to Lt shoulder with emphasis on scapular stabilization.     Pt will benefit from skilled therapeutic intervention in order to improve on the following deficits Decreased strength;Impaired flexibility;Pain   Rehab Potential Good   PT Frequency 1x / week   PT Duration 8 weeks   PT Treatment/Interventions ADLs/Self Care Home Management;Cryotherapy;Electrical Stimulation;Moist Heat;Therapeutic exercise;Therapeutic activities;Ultrasound;Neuromuscular re-education;Patient/family education;Manual  techniques;Passive range of motion;Taping   PT Next Visit Plan Pilates based core instruction (level one-TA), scapular stabilization, manual to bilateral lumbar parspinals.  See what MD says about Lt shoulder   Consulted and Agree with Plan of Care Patient         Problem List Patient Active Problem List   Diagnosis Date Noted  . Headache, menstrual migraine 01/09/2014  . CHEST PAIN 06/06/2010  . ESSENTIAL HYPERTENSION, BENIGN 04/03/2010  . CARDIOMYOPATHY 11/15/2008  . PARESTHESIA 11/15/2008  . PALPITATIONS 11/15/2008    Charae Depaolis, PT 05/22/2015, 3:32 PM  [facility] [address], [address] [place], [place], [address] Phone: [phone]   Fax:  [phone]  Name: Lauren SowRebecca A Ashworth MRN: [id number] Date of Birth: 12/09/1965  PT LONG TERM GOAL #2   Title reduce FOTO to < or = to 36% limitation   Time 8   Period Weeks   Status New   PT LONG TERM GOAL #3   Title report 60% reduction in LBP with home and work tasks   Time 8   Period Weeks   Status New   PT LONG TERM GOAL #4   Title verbalize and demonstrate progression of core stabilization exercises    Time 8   Period Weeks   Status New   PT LONG TERM GOAL #5   Title report a 60% reduction in Lt shoulder pain with reaching out or across the body   Time 8   Period Weeks   Status New               Plan - 05/22/15 1521    Clinical Impression Statement Pt presents to PT with complaints of 6 month history of Rt sided lumbar/SI joint pain without cause and 3 month history of Lt shoulder pain without injury. Pt stopped all regular exercise due to pain.  Pt demonstrates painul Lt shoulder motion against gravity, weakness in the posterior shoulder/scapula, core weakness and tension in bilateral lumbar paraspinals.  FOTO score is 43% limitation.  Pt will benefit from progression of core stabilization and hip/lumbar flexibility, body mechanics education and manual/modalities/strength to Lt shoulder with emphasis on scapular stabilization.     Pt will benefit from skilled therapeutic intervention in order to improve on the following deficits Decreased strength;Impaired flexibility;Pain   Rehab Potential Good   PT Frequency 1x / week   PT Duration 8 weeks   PT Treatment/Interventions ADLs/Self Care Home Management;Cryotherapy;Electrical Stimulation;Moist Heat;Therapeutic exercise;Therapeutic activities;Ultrasound;Neuromuscular re-education;Patient/family education;Manual  techniques;Passive range of motion;Taping   PT Next Visit Plan Pilates based core instruction (level one-TA), scapular stabilization, manual to bilateral lumbar parspinals.  See what MD says about Lt shoulder   Consulted and Agree with Plan of Care Patient         Problem List Patient Active Problem List   Diagnosis Date Noted  . Headache, menstrual migraine 01/09/2014  . CHEST PAIN 06/06/2010  . ESSENTIAL HYPERTENSION, BENIGN 04/03/2010  . CARDIOMYOPATHY 11/15/2008  . PARESTHESIA 11/15/2008  . PALPITATIONS 11/15/2008    Charae Depaolis, PT 05/22/2015, 3:32 PM  [facility] [address], [address] [place], [place], [address] Phone: [phone]   Fax:  [phone]  Name: Lauren SowRebecca A Ashworth MRN: [id number] Date of Birth: 12/09/1965

## 2015-06-13 ENCOUNTER — Ambulatory Visit: Payer: Managed Care, Other (non HMO) | Attending: Orthopedic Surgery | Admitting: Physical Therapy

## 2015-06-13 ENCOUNTER — Encounter: Payer: Self-pay | Admitting: Physical Therapy

## 2015-06-13 DIAGNOSIS — M5441 Lumbago with sciatica, right side: Secondary | ICD-10-CM | POA: Diagnosis not present

## 2015-06-13 DIAGNOSIS — M25512 Pain in left shoulder: Secondary | ICD-10-CM | POA: Diagnosis present

## 2015-06-13 DIAGNOSIS — M6281 Muscle weakness (generalized): Secondary | ICD-10-CM | POA: Insufficient documentation

## 2015-06-13 DIAGNOSIS — R29898 Other symptoms and signs involving the musculoskeletal system: Secondary | ICD-10-CM

## 2015-06-13 NOTE — Therapy (Signed)
Affecting Rehab Potential None   PT Frequency 1x / week   PT Duration 8 weeks   PT Treatment/Interventions ADLs/Self Care Home Management;Cryotherapy;Electrical Stimulation;Moist Heat;Therapeutic exercise;Therapeutic activities;Ultrasound;Neuromuscular re-education;Patient/family education;Manual techniques;Passive range of motion;Taping   PT Next Visit Plan Pilates based core instruction (level one-TA), scapular stabilization, manual to bilateral lumbar parspinals.  Left shoulder stabilization exercises; check pelvic alignment   PT Home Exercise Plan progress shoulder with stability exercises   Recommended Other Services None   Consulted and Agree with Plan of Care Patient        Problem List Patient Active Problem List   Diagnosis Date Noted  . Headache, menstrual migraine 01/09/2014  . CHEST PAIN 06/06/2010  . ESSENTIAL HYPERTENSION, BENIGN 04/03/2010  . CARDIOMYOPATHY 11/15/2008  . PARESTHESIA 11/15/2008  . PALPITATIONS 11/15/2008    Lauren [name], PT 06/13/2015 10:48 AM    [facility] [address] [place], [place], [address] Phone: [phone]   Fax:  [phone]  Name: Lauren [name] MRN: [id number] Date of Birth: 1966/04/23  Affecting Rehab Potential None   PT Frequency 1x / week   PT Duration 8 weeks   PT Treatment/Interventions ADLs/Self Care Home Management;Cryotherapy;Electrical Stimulation;Moist Heat;Therapeutic exercise;Therapeutic activities;Ultrasound;Neuromuscular re-education;Patient/family education;Manual techniques;Passive range of motion;Taping   PT Next Visit Plan Pilates based core instruction (level one-TA), scapular stabilization, manual to bilateral lumbar parspinals.  Left shoulder stabilization exercises; check pelvic alignment   PT Home Exercise Plan progress shoulder with stability exercises   Recommended Other Services None   Consulted and Agree with Plan of Care Patient        Problem List Patient Active Problem List   Diagnosis Date Noted  . Headache, menstrual migraine 01/09/2014  . CHEST PAIN 06/06/2010  . ESSENTIAL HYPERTENSION, BENIGN 04/03/2010  . CARDIOMYOPATHY 11/15/2008  . PARESTHESIA 11/15/2008  . PALPITATIONS 11/15/2008    Lauren [name], PT 06/13/2015 10:48 AM    [facility] [address] [place], [place], [address] Phone: [phone]   Fax:  [phone]  Name: Lauren [name] MRN: [id number] Date of Birth: 1966/04/23  [facility] [address] [place], [place], [address] Phone: [phone]   Fax:  [phone]  Physical Therapy Treatment  Patient Details  Name: Lauren [name] MRN: [id number] Date of Birth: 03-10-1966 Referring Provider: Anette Riedel, PA  Encounter Date: 06/13/2015      PT End of Session - 06/13/15 0936    Visit Number 2   Date for PT Re-Evaluation 07/17/15   PT Start Time 0932   PT Stop Time 1035   PT Time Calculation (min) 63 min   Activity Tolerance Patient tolerated treatment well   Behavior During Therapy [facility] for tasks assessed/performed      Past Medical History  Diagnosis Date  . Palpitations   . Cardiomyopathy     post-partum 2002  . Paresthesia   . Pain of cervical spine   . Right ear pain   . 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 Dizziness   . Menstrual migraine   . History of cardiomyopathy   . Iron deficiency anemia     Past Surgical History  Procedure Laterality Date  . Echocardiogram stress test  12/30/10  . Transthoracic echocardiogram  03/11/06    There were no vitals filed for this visit.  Visit Diagnosis:  Right-sided low back pain with right-sided sciatica  Pain in joint of left shoulder  Weakness of back      Subjective Assessment - 06/13/15 0936    Subjective Patient reports reports she had a cortisone shot last week into left shoulder and was feeling good. Patient walked her dogs yesterday  when her pain flared up today.  I have been doing some exercises for my back and left shoulder.  MRI showed a torn left labrum.   Diagnostic tests MRI of shoulder: possible posterior labral tear.  MRI of lumbar spine: moderate DDD, no significant disc issues per pt report.     Patient Stated Goals reduce LBP, reduce shoulder pain    Currently in Pain? Yes   Pain Score 2    Pain Location Back   Pain Orientation Right;Lower   Pain Descriptors / Indicators  Dull   Pain Radiating Towards goes into right hip and gluteals   Pain Frequency Intermittent   Aggravating Factors  positional    Pain Relieving Factors aleve and rolling on hard foam roll   Pain Score 2   Pain Location Shoulder   Pain Orientation Left   Pain Descriptors / Indicators Aching;Dull   Pain Type Chronic pain   Pain Onset More than a month ago   Pain Frequency Intermittent   Aggravating Factors  lifting, shoulder abduction,    Pain Relieving Factors arm at rest            [facility] PT Assessment - 06/13/15 0001    Palpation   SI assessment  right ilium is anteriorly rotated; rotated right sacrum                     [facility] Adult PT Treatment/Exercise - 06/13/15 0001    Lumbar Exercises: Aerobic   Stationary Bike level 3 8 mion   Lumbar Exercises: Standing   Other Standing Lumbar Exercises standing lumbar extension with heel of hand pressing on L5 up and forward 10x   Lumbar Exercises: Supine   Bridge 15 reps  with knees in and out   Lumbar Exercises: Sidelying   Clam 10 reps  bil.    Shoulder Exercises: Standing

## 2015-06-13 NOTE — Patient Instructions (Signed)
  [place]  1) Lie on your back with your feet together and a band around your knees. 2) Keeping your feet together, separate your knees so they are outside your hips 3) Keep your knees apart, squeeze your buttocks muscles, and lift your hips into the air 4) Keep your feet together and knees apart and lower back to the ground.  Repeat.  Remember:  this should not hurt your back.  If it does, focus on squeezing your butt muscles, hold a posterior pelvic tilt, and do not lift as high.  Your back is probably arching too much.     Standing with feet about shoulder width apart and hands in the small of lower back.  Slowly bend backwards as far as possible without forcing the movement.   This is a movement not a stretch so go as far as possible but stay only 1-2 seconds maximum.     While lying on your side with your knees bent, draw up the top knee while keeping contact of your feet together.  Do not let your pelvis roll back during the lifting movement.    [facility] [address], [address] [place], [place] [address] Phone # [phone] Fax [phone]

## 2015-06-20 ENCOUNTER — Encounter: Payer: Managed Care, Other (non HMO) | Admitting: Physical Therapy

## 2015-06-27 ENCOUNTER — Ambulatory Visit: Payer: Managed Care, Other (non HMO) | Admitting: Physical Therapy

## 2015-06-27 ENCOUNTER — Encounter: Payer: Self-pay | Admitting: Physical Therapy

## 2015-06-27 DIAGNOSIS — R29898 Other symptoms and signs involving the musculoskeletal system: Secondary | ICD-10-CM

## 2015-06-27 DIAGNOSIS — M5441 Lumbago with sciatica, right side: Secondary | ICD-10-CM

## 2015-06-27 DIAGNOSIS — M25512 Pain in left shoulder: Secondary | ICD-10-CM

## 2015-06-27 NOTE — Therapy (Signed)
[facility] [address] [place], [place], [address] Phone: [phone]   Fax:  [phone]  Physical Therapy Treatment  Patient Details  Name: Lauren [name] MRN: [id number] Date of Birth: Apr 25, 1966 Referring Provider: Anette Riedel, PA  Encounter Date: 06/27/2015      PT End of Session - 06/27/15 1224    Visit Number 3   PT Start Time 1145   PT Stop Time 1224   PT Time Calculation (min) 39 min   Activity Tolerance Patient tolerated treatment well   Behavior During Therapy [facility] for tasks assessed/performed      Past Medical History  Diagnosis Date  . Palpitations   . Cardiomyopathy     post-partum 2002  . Paresthesia   . Pain of cervical spine   . Right ear pain   . 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 Dizziness   . Menstrual migraine   . History of cardiomyopathy   . Iron deficiency anemia     Past Surgical History  Procedure Laterality Date  . Echocardiogram stress test  12/30/10  . Transthoracic echocardiogram  03/11/06    There were no vitals filed for this visit.  Visit Diagnosis:  Right-sided low back pain with right-sided sciatica  Pain in joint of left shoulder  Weakness of back      Subjective Assessment - 06/27/15 1152    Subjective I have progressed myself with exercise for shoulder. NO pain in left shoulder just tightness.    Diagnostic tests MRI of shoulder: possible posterior labral tear.  MRI of lumbar spine: moderate DDD, no significant disc issues per pt report.     Patient Stated Goals reduce LBP, reduce shoulder pain    Currently in Pain? Yes   Pain Score 6    Pain Location Back   Pain Orientation Right;Left   Pain Descriptors / Indicators Dull   Pain Type Chronic pain   Pain Onset More than a month ago   Pain Frequency Constant   Aggravating Factors  positional, movement   Pain Relieving Factors alleve and rolling on hard foam roll   Multiple Pain Sites No            [facility] PT Assessment - 06/27/15 0001    AROM   Overall AROM Comments lumbar extension decreased by 50%, right rotation decreased by 50%   Palpation   Spinal mobility L4 and L5 rotated right   SI assessment  Pelvis in correct alignment                     [facility] Adult PT Treatment/Exercise - 06/27/15 0001    Lumbar Exercises: Aerobic   Stationary Bike level 3 6 min   Manual Therapy   Manual Therapy Joint mobilization;Soft tissue mobilization;Muscle Energy Technique   Joint Mobilization gapping of right L4-S1 facet in sidely; prone press up wiht p-a glides to L4-L5 ; sitting with upslide glide to L4 and 5   Soft tissue mobilization to right quadratus                PT Education - 06/27/15 1227    Education provided Yes   Education Details plank, lumbar extension    Person(s) Educated Patient   Methods Explanation;Demonstration;Handout   Comprehension Returned demonstration;Verbalized understanding          PT Short Term Goals - 06/13/15 1046    PT SHORT TERM GOAL #1   Title be  independent in initial HEP   Time 4   Period Weeks   Status Achieved   PT SHORT TERM GOAL #2   Title report a 30% reduction in LBP with home and work tasks   Time 4   Period Weeks   Status On-going   PT SHORT TERM GOAL #3   Title report a 30% reduction in Lt shoulder pain with use   Time 4   Period Weeks   Status On-going           PT Long Term Goals - 06/27/15 1223    PT LONG TERM GOAL #1   Title be independent in advanced HEP   Time 8   Period Weeks   Status New  still learning exercises   PT LONG TERM GOAL #2   Title reduce FOTO to < or = to 36% limitation   Time 8   Period Weeks   Status New  has not done it yet   PT LONG TERM GOAL #3   Title report 60% reduction in LBP with home and work tasks   Time 8   Status On-going   PT LONG TERM GOAL #4   Title verbalize and demonstrate progression of core stabilization  exercises    Time 8   Period Weeks   Status On-going  still learning   PT LONG TERM GOAL #5   Title report a 60% reduction in Lt shoulder pain with reaching out or across the body   Time 8   Status On-going  40% better               Plan - 06/27/15 1224    Clinical Impression Statement Patient does not have left shoulder pain today.  Patient is complaining of lumbar pain.  Patient has been progressing left shoulder strength on her own.  After treatment patient had full lumbar rotation to right but extension improved by 25%.  Right quadartus and L4-S! fascet are tight. Patient will benefit form phsycial therapy to redue pain and progress HEP.    Pt will benefit from skilled therapeutic intervention in order to improve on the following deficits Decreased strength;Impaired flexibility;Pain   Rehab Potential Good   Clinical Impairments Affecting Rehab Potential None   PT Frequency 1x / week   PT Duration 8 weeks   PT Treatment/Interventions ADLs/Self Care Home Management;Cryotherapy;Electrical Stimulation;Moist Heat;Therapeutic exercise;Therapeutic activities;Ultrasound;Neuromuscular re-education;Patient/family education;Manual techniques;Passive range of motion;Taping   PT Next Visit Plan See if patient will be discharged or renewal.    PT Home Exercise Plan progress shoulder with stability exercises   Consulted and Agree with Plan of Care Patient        Problem List Patient Active Problem List   Diagnosis Date Noted  . Headache, menstrual migraine 01/09/2014  . CHEST PAIN 06/06/2010  . ESSENTIAL HYPERTENSION, BENIGN 04/03/2010  . CARDIOMYOPATHY 11/15/2008  . PARESTHESIA 11/15/2008  . PALPITATIONS 11/15/2008    Eulis Foster, PT 06/27/2015 12:28 PM   [facility] [address], [address] [place], [place], [address] Phone: [phone]   Fax:  [phone]  Name: KIERYN BURTIS MRN: [id number] Date of Birth:  Mar 10, 1966

## 2015-06-27 NOTE — Patient Instructions (Signed)
Prone Plank (Eccentric)    On toes and elbows, pull abdomen in while stabilizing trunk. Push chest backwards due to scapula winging. HOld 30 seconds _2__ reps per set, ___ sets per day, _1__ days per week.  http://ecce.exer.us/243   Copyright  VHI. All rights reserved.   Trunk: Prone Extension (Press-Ups)    Lie on stomach on firm, flat surface. Relax bottom and legs. Raise chest in air with elbows straight. Keep hips flat on surface, sag stomach. Hold __1__ seconds. Repeat _10___ times. Do __2__ sessions per day. CAUTION: Movement should be gentle and slow.  Copyright  VHI. All rights reserved.   Scapula exercises due to winging of scapula. [facility] [address] [place], [place] [address] Phone # [phone] Fax [phone]

## 2015-07-11 ENCOUNTER — Ambulatory Visit: Payer: Managed Care, Other (non HMO) | Attending: Orthopedic Surgery | Admitting: Physical Therapy

## 2015-07-11 ENCOUNTER — Encounter: Payer: Self-pay | Admitting: Physical Therapy

## 2015-07-11 DIAGNOSIS — M5441 Lumbago with sciatica, right side: Secondary | ICD-10-CM | POA: Diagnosis not present

## 2015-07-11 DIAGNOSIS — M25512 Pain in left shoulder: Secondary | ICD-10-CM | POA: Insufficient documentation

## 2015-07-11 DIAGNOSIS — M6281 Muscle weakness (generalized): Secondary | ICD-10-CM | POA: Insufficient documentation

## 2015-07-11 DIAGNOSIS — R29898 Other symptoms and signs involving the musculoskeletal system: Secondary | ICD-10-CM

## 2015-07-11 NOTE — Therapy (Signed)
[facility] [address] [place], [place], [address] Phone: [phone]   Fax:  [phone]  Physical Therapy Treatment  Patient Details  Name: Lauren [name] MRN: [id number] Date of Birth: [birth year] Referring Provider: Anette Riedel, [place]  Encounter Date: 07/11/2015      PT End of Session - 07/11/15 1040    Visit Number 4   Date for PT Re-Evaluation 07/17/15   PT Start Time 1033  15 min late   PT Stop Time 1056   PT Time Calculation (min) 23 min   Activity Tolerance Patient tolerated treatment well   Behavior During Therapy [facility] for tasks assessed/performed      Past Medical History  Diagnosis Date  . Palpitations   . Cardiomyopathy     post-partum 2002  . Paresthesia   . Pain of cervical spine   . Right ear pain   . 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 Dizziness   . Menstrual migraine   . History of cardiomyopathy   . Iron deficiency anemia     Past Surgical History  Procedure Laterality Date  . Echocardiogram stress test  12/30/10  . Transthoracic echocardiogram  03/11/06    There were no vitals filed for this visit.  Visit Diagnosis:  Right-sided low back pain with right-sided sciatica  Pain in joint of left shoulder  Weakness of back      Subjective Assessment - 07/11/15 1036    Subjective Had MRI of low back and shoulder. Shoulder labral tear, putting off any surgery at this time bc it is doing well. MRI of back showed facet arthritis and mild disc bulge L5 S1.    Currently in Pain? Yes   Pain Score 4    Pain Location Back   Pain Orientation Right   Pain Descriptors / Indicators Dull   Aggravating Factors  Movement especialy extension   Pain Relieving Factors exercises   Multiple Pain Sites No            [facility] PT Assessment - 07/11/15 0001    Observation/Other Assessments   Focus on Therapeutic Outcomes (FOTO)  37% limited   AROM   Overall AROM  Comments Lumbar extension limited 50% with pain, Right rotation WNL no pain today   Strength   Overall Strength Comments Rt hip extension 4-/5, LT 4+/5                             PT Education - 07/11/15 1055    Education provided Yes   Education Details Exercising in neutral spine, quadruped alt UE/LE, prone multifidus and SLR for advanced HEP.   Person(s) Educated Patient   Methods Explanation;Demonstration;Tactile cues;Verbal cues   Comprehension Verbalized understanding;Returned demonstration          PT Short Term Goals - 07/11/15 1051    PT SHORT TERM GOAL #2   Title report a 30% reduction in LBP with home and work tasks   Time 4   Period Weeks   Status Achieved  70%   PT SHORT TERM GOAL #3   Title report a 30% reduction in Lt shoulder pain with use   Time 4   Period Weeks   Status Achieved  100%           PT Long Term Goals - 07/11/15 1052    PT LONG TERM GOAL #1   Title be independent in  advanced HEP   Time 8   Period Weeks   Status Achieved   PT LONG TERM GOAL #2   Title reduce FOTO to < or = to 36% limitation   Time 8   Period Weeks   Status --  Pt scored 37% today.   PT LONG TERM GOAL #3   Title report 60% reduction in LBP with home and work tasks   Time 8   Status Achieved  70%   PT LONG TERM GOAL #4   Title verbalize and demonstrate progression of core stabilization exercises    Time 8   Period Weeks   Status Achieved   PT LONG TERM GOAL #5   Title report a 60% reduction in Lt shoulder pain with reaching out or across the body   Period Weeks   Status Achieved  100%               Plan - 07/11/15 1057    Clinical Impression Statement Pt reports her shoulder pain is 100% better despite having a labral tear confirmed by MRI. She also had a confirmed lumbar facet arthritis and mild disc bulge at L5 S1. Pain here is 70% better, but reportedly increases when she "does things."  FOTO goal was 36%, she scored a 37%.   She met the majority  of her long term goals.  HEP was advanced to include some neutral spine stabilization exercises . She was educated in why neutral spine exercises may be the best choice for her  going forward.    Pt will benefit from skilled therapeutic intervention in order to improve on the following deficits Decreased strength;Impaired flexibility;Pain   Rehab Potential Good   Clinical Impairments Affecting Rehab Potential None   PT Frequency 1x / week   PT Duration 8 weeks   PT Treatment/Interventions ADLs/Self Care Home Management;Cryotherapy;Electrical Stimulation;Moist Heat;Therapeutic exercise;Therapeutic activities;Ultrasound;Neuromuscular re-education;Patient/family education;Manual techniques;Passive range of motion;Taping   PT Next Visit Plan Pt requests discharge to HEP.   Consulted and Agree with Plan of Care Patient        Problem List Patient Active Problem List   Diagnosis Date Noted  . Headache, menstrual migraine 01/09/2014  . CHEST PAIN 06/06/2010  . ESSENTIAL HYPERTENSION, BENIGN 04/03/2010  . CARDIOMYOPATHY 11/15/2008  . PARESTHESIA 11/15/2008  . PALPITATIONS 11/15/2008    Ane Payment, PTA 07/11/2015 11:11 AM PHYSICAL THERAPY DISCHARGE SUMMARY  Visits from Start of Care: 4  Current functional level related to goals / functional outcomes: See above for current status   Remaining deficits: See above   Education / Equipment: HEP, body mechanics education Plan: Patient agrees to discharge.  Patient goals were met. Patient is being discharged due to meeting the stated rehab goals.  ?????   [name], PT 07/11/2015 11:21 AM  [facility] [address] [place], [place], [address] Phone: [phone]   Fax:  [phone]  Name: Lauren [name] MRN: [id number] Date of Birth: Oct 14, 1965

## 2015-07-30 ENCOUNTER — Encounter: Payer: Self-pay | Admitting: Adult Health

## 2015-07-30 ENCOUNTER — Ambulatory Visit (INDEPENDENT_AMBULATORY_CARE_PROVIDER_SITE_OTHER): Payer: Managed Care, Other (non HMO) | Admitting: Adult Health

## 2015-07-30 VITALS — BP 132/84 | HR 69 | Ht 64.0 in | Wt 123.6 lb

## 2015-07-30 DIAGNOSIS — G43839 Menstrual migraine, intractable, without status migrainosus: Secondary | ICD-10-CM | POA: Diagnosis not present

## 2015-07-30 DIAGNOSIS — N943 Premenstrual tension syndrome: Secondary | ICD-10-CM | POA: Diagnosis not present

## 2015-07-30 MED ORDER — SUMATRIPTAN SUCCINATE 100 MG PO TABS: ORAL_TABLET | ORAL | Status: DC

## 2015-07-30 NOTE — Progress Notes (Signed)
I have read the note, and I agree with the clinical assessment and plan.  WILLIS,[name] KEITH   

## 2015-07-30 NOTE — Progress Notes (Signed)
PATIENT: Lauren [name] DOB: [birth year]  REASON FOR VISIT: follow up- migraines HISTORY FROM: patient  HISTORY OF PRESENT ILLNESS [name] is a 50 year old female with a history of sensory alterations in the arms and legs as well as migraines. She returns today for follow-up. At the last visit the patient did not want to resume Topamax. She has followed up with her OB/GYN and they do feel that she is in menopause. She is currently undergoing some test with them. The patient states that her headaches have gotten worse. She states that she'll go  3 months without a menstrual cycle and therefore will not have a headache. However this month she's had 3 cycles and has suffered with several headaches. She states that the location can vary but normally is on the left side of the head. She does have photophobia and phonophobia as well as nausea. She also has some blurred vision. She is currently taking sumatriptan and that works well for her headaches. She does tend to use up all of her tablets before the end of the month. She denies any new neurological symptoms. She returns today for an evaluation.  HISTORY 01/09/14: Lauren [name] is a 50 year old female with a history of sensory alteration in her arms and legs and migraines. She currently takes treximet and Topamax for her headaches. She states that she has been having some side effects since taking the Topamax. She has aching feeling her hands and feet. At this time she no longer wants to take the Topamax nor any other medication. Patient's headache coincide with her menstrual cycle. She believes that she is premenopausal. Her menstrual cycle is not regular therefore she can have headaches throughout the month. She has an appointment with her OBGYN in the next few months.   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 She returns today for follow-up. Patient was started on Effexor and klonopin and  that has helped her anxiety. The patient started taking magnesium and states that since then she only has about 7 days out of the month where she will have sensory alterations. Those consist of vibration sensation in the ankles, some numbness over knees, and tightening of muscles in the lower legs. Patient is no longer taking the propanolol- she is unsure why she stopped. She was given a prescription for Topamax but never started because of the potential side effects. The patient currently has approximately 16 headaches a month however they are dull headaches. She uses the treximet frequently to prevent severe migraines. She is using up her medication (treximet) rapidly due to the amount of headaches she has. Patient states that noise and light bothers her but denies nausea or vomiting. Patient has a history of headaches since she was 50 years old. No new medical issues since the last visit.   REVIEW OF SYSTEMS: Out of a complete 14 system review of symptoms, the patient complains only of the following symptoms, and all other reviewed systems are negative.  ALLERGIES: Allergies  Allergen Reactions  . Doxycycline     REACTION: rash    HOME MEDICATIONS: Outpatient Prescriptions Prior to Visit  Medication Sig Dispense Refill  . amoxicillin (AMOXIL) 500 MG capsule As needed for dental    . clonazePAM (KLONOPIN) 0.5 MG tablet 1 tablet daily. Reported on 05/22/2015    . glucosamine-chondroitin 500-400 MG tablet Take 1 tablet by mouth 3 (three) times daily.    . Magnesium 200 MG TABS Take  1 tablet by mouth 2 (two) times daily.    . Multiple Vitamins-Minerals (MULTIVITAMIN PO) Take 1 tablet by mouth daily.    Marland Kitchen TREXIMET 85-500 MG per tablet Take 1 tablet by mouth as needed.     . venlafaxine XR (EFFEXOR-XR) 75 MG 24 hr capsule 1 capsule daily.     No facility-administered medications prior to visit.    PAST MEDICAL HISTORY: Past Medical History  Diagnosis Date  . Palpitations   . Cardiomyopathy       post-partum 2002  . Paresthesia   . Pain of cervical spine   . Right ear pain   . 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 Dizziness   . Menstrual migraine   . History of cardiomyopathy   . Iron deficiency anemia     PAST SURGICAL HISTORY: Past Surgical History  Procedure Laterality Date  . Echocardiogram stress test  12/30/10  . Transthoracic echocardiogram  03/11/06    FAMILY HISTORY: Family History  Problem Relation Age of Onset  . Cancer Maternal Grandmother     reproductive organs  . Cancer Paternal Grandmother   . Hypertension Father     SOCIAL HISTORY: Social History   Social History  . Marital Status: Married    Spouse Name: Jonny Ruiz  . Number of Children: 2  . Years of Education: 14   Occupational History  . Physical therapy assistant   .     Social History Main Topics  . Smoking status: Former Games developer  . Smokeless tobacco: Never Used     Comment: started at 16, smoked less than 1 ppd; quit in 1990  . Alcohol Use: 0.0 oz/week    0 drink(s) per week     Comment: approximately 3-5 drinks/week   . Drug Use: No  . Sexual Activity:    Partners: Male   Other Topics Concern  . Not on file   Social History Narrative   Patient is Married ([name]) and lives at home with her family.   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   Patient is left-handed.   Patient drinks one cup of coffee and two cups per day.      PHYSICAL EXAM  Filed Vitals:   07/30/15 1105  BP: 132/84  Pulse: 69  Height: 5\' 4"  (1.626 m)  Weight: 123 lb 9.6 oz (56.065 kg)   Body mass index is 21.21 kg/(m^2).  Generalized: Well developed, in no acute distress   Neurological examination  Mentation: Alert oriented to time, place, history taking. Follows all commands speech and language fluent Cranial nerve II-XII: Pupils were equal round reactive to light. Extraocular movements were  full, visual field were full on confrontational test. Facial sensation and strength were normal. Uvula tongue midline. Head turning and shoulder shrug  were normal and symmetric. Motor: The motor testing reveals 5 over 5 strength of all 4 extremities. Good symmetric motor tone is noted throughout.  Sensory: Sensory testing is intact to soft touch on all 4 extremities. No evidence of extinction is noted.  Coordination: Cerebellar testing reveals good finger-nose-finger and heel-to-shin bilaterally.  Gait and station: Gait is normal. Tandem gait is normal. Romberg is negative. No drift is seen.  Reflexes: Deep tendon reflexes are symmetric and normal bilaterally.   DIAGNOSTIC DATA (LABS, IMAGING, TESTING) - I reviewed patient records, labs, notes, testing and imaging myself where available.  ASSESSMENT AND PLAN 50 y.o. 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 here with:  1. Menstrual migraines  The patient would like to try some additional medication in the hopes of preventing her headaches. I have recommended Topamax extended release, Depakote or gabapentin. The patient would like to look over these medications before deciding. She will continue on sumatriptan. I have sent in for refill. Patient will follow-up in 3-4 months or sooner if needed.     Butch Penny, MSN, NP-C 07/30/2015, 11:06 AM [facility] [address] [place], [place] [address] [phone]

## 2015-07-30 NOTE — Patient Instructions (Addendum)
Continue Sumatriptan  Consider Depakote, Gabapentin or Trokendi  Topiramate extended-release capsules What is this medicine? TOPIRAMATE (toe PYRE a mate) is used to treat seizures in adults or children with epilepsy. This medicine may be used for other purposes; ask your health care provider or pharmacist if you have questions. What should I tell my health care provider before I take this medicine? They need to know if you have any of these conditions: -cirrhosis of the liver or liver disease -diarrhea -glaucoma -kidney stones or kidney disease -lung disease like asthma, obstructive pulmonary disease, emphysema -metabolic acidosis -on a ketogenic diet -scheduled for surgery or a procedure -suicidal thoughts, plans, or attempt; a previous suicide attempt by you or a family member -an unusual or allergic reaction to topiramate, other medicines, foods, dyes, or preservatives -pregnant or trying to get pregnant -breast-feeding How should I use this medicine? Take this medicine by mouth with a glass of water. Follow the directions on the prescription label. Trokendi XR capsules must be swallowed whole. Do not sprinkle on food, break, crush, dissolve, or chew. Qudexy XR capsules may be swallowed whole or opened and sprinkled on a small amount of soft food. This mixture must be swallowed immediately. Do not chew or store mixture for later use. You may take this medicine with meals. Take your medicine at regular intervals. Do not take it more often than directed. Talk to your pediatrician regarding the use of this medicine in children. Special care may be needed. While Trokendi XR may be prescribed for children as young as 6 years and Qudexy XR may be prescribed for children as young as 2 years for selected conditions, precautions do apply. Overdosage: If you think you have taken too much of this medicine contact a poison control center or emergency room at once. NOTE: This medicine is only for you.  Do not share this medicine with others. What if I miss a dose? If you miss a dose, take it as soon as you can. If it is almost time for your next dose, take only that dose. Do not take double or extra doses. What may interact with this medicine? Do not take this medicine with any of the following medications: -probenecid This medicine may also interact with the following medications: -acetazolamide -alcohol -amitriptyline -birth control pills -digoxin -hydrochlorothiazide -lithium -medicines for pain, sleep, or muscle relaxation -metformin -methazolamide -other seizure or epilepsy medicines -pioglitazone -risperidone This list may not describe all possible interactions. Give your health care provider a list of all the medicines, herbs, non-prescription drugs, or dietary supplements you use. Also tell them if you smoke, drink alcohol, or use illegal drugs. Some items may interact with your medicine. What should I watch for while using this medicine? Visit your doctor or health care professional for regular checks on your progress. Do not stop taking this medicine suddenly. This increases the risk of seizures if you are using this medicine to control epilepsy. Wear a medical identification bracelet or chain to say you have epilepsy or seizures, and carry a card that lists all your medicines. This medicine can decrease sweating and increase your body temperature. Watch for signs of deceased sweating or fever, especially in children. Avoid extreme heat, hot baths, and saunas. Be careful about exercising, especially in hot weather. Contact your health care provider right away if you notice a fever or decrease in sweating. You should drink plenty of fluids while taking this medicine. If you have had kidney stones in the past, this will  help to reduce your chances of forming kidney stones. If you have stomach pain, with nausea or vomiting and yellowing of your eyes or skin, call your doctor  immediately. You may get drowsy, dizzy, or have blurred vision. Do not drive, use machinery, or do anything that needs mental alertness until you know how this medicine affects you. To reduce dizziness, do not sit or stand up quickly, especially if you are an older patient. Alcohol can increase drowsiness and dizziness. Avoid alcoholic drinks. Do not drink alcohol for 6 hours before or 6 hours after taking Trokendi XR. If you notice blurred vision, eye pain, or other eye problems, seek medical attention at once for an eye exam. The use of this medicine may increase the chance of suicidal thoughts or actions. Pay special attention to how you are responding while on this medicine. Any worsening of mood, or thoughts of suicide or dying should be reported to your health care professional right away. This medicine may increase the chance of developing metabolic acidosis. If left untreated, this can cause kidney stones, bone disease, or slowed growth in children. Symptoms include breathing fast, fatigue, loss of appetite, irregular heartbeat, or loss of consciousness. Call your doctor immediately if you experience any of these side effects. Also, tell your doctor about any surgery you plan on having while taking this medicine since this may increase your risk for metabolic acidosis. Birth control pills may not work properly while you are taking this medicine. Talk to your doctor about using an extra method of birth control. Women who become pregnant while using this medicine may enroll in the [place] [employer] Antiepileptic Drug Pregnancy Registry by calling [phone]. This registry collects information about the safety of antiepileptic drug use during pregnancy. What side effects may I notice from receiving this medicine? Side effects that you should report to your doctor or health care professional as soon as possible: -allergic reactions like skin rash, itching or hives, swelling of the face, lips, or  tongue -decreased sweating and/or rise in body temperature -depression -difficulty breathing, fast or irregular breathing patterns -difficulty speaking -difficulty walking or controlling muscle movements -hearing impairment -redness, blistering, peeling or loosening of the skin, including inside the mouth -tingling, pain or numbness in the hands or feet -unusually weak or tired -worsening of mood, thoughts or actions of suicide or dying Side effects that usually do not require medical attention (Report these to your doctor or health care professional if they continue or are bothersome.): -altered taste -back pain, joint or muscle aches and pains -diarrhea, or constipation -headache -loss of appetite -nausea -stomach upset, indigestion -tremors This list may not describe all possible side effects. Call your doctor for medical advice about side effects. You may report side effects to FDA at 1-800-FDA-1088. Where should I keep my medicine? Keep out of the reach of children. Store at room temperature between 15 and 30 degrees C (59 and 86 degrees F) in a tightly closed container. Protect from moisture. Throw away any unused medicine after the expiration date. NOTE: This sheet is a summary. It may not cover all possible information. If you have questions about this medicine, talk to your doctor, pharmacist, or health care provider.    2016, Elsevier/Gold Standard. (2012-08-17 15:33:26) Gabapentin capsules or tablets What is this medicine? GABAPENTIN (GA ba pen tin) is used to control partial seizures in adults with epilepsy. It is also used to treat certain types of nerve pain. This medicine may be used for other purposes;  ask your health care provider or pharmacist if you have questions. What should I tell my health care provider before I take this medicine? They need to know if you have any of these conditions: -kidney disease -suicidal thoughts, plans, or attempt; a previous suicide  attempt by you or a family member -an unusual or allergic reaction to gabapentin, other medicines, foods, dyes, or preservatives -pregnant or trying to get pregnant -breast-feeding How should I use this medicine? Take this medicine by mouth with a glass of water. Follow the directions on the prescription label. You can take it with or without food. If it upsets your stomach, take it with food.Take your medicine at regular intervals. Do not take it more often than directed. Do not stop taking except on your doctor's advice. If you are directed to break the 600 or 800 mg tablets in half as part of your dose, the extra half tablet should be used for the next dose. If you have not used the extra half tablet within 28 days, it should be thrown away. A special MedGuide will be given to you by the pharmacist with each prescription and refill. Be sure to read this information carefully each time. Talk to your pediatrician regarding the use of this medicine in children. Special care may be needed. Overdosage: If you think you have taken too much of this medicine contact a poison control center or emergency room at once. NOTE: This medicine is only for you. Do not share this medicine with others. What if I miss a dose? If you miss a dose, take it as soon as you can. If it is almost time for your next dose, take only that dose. Do not take double or extra doses. What may interact with this medicine? Do not take this medicine with any of the following medications: -other gabapentin products This medicine may also interact with the following medications: -alcohol -antacids -antihistamines for allergy, cough and cold -certain medicines for anxiety or sleep -certain medicines for depression or psychotic disturbances -homatropine; hydrocodone -naproxen -narcotic medicines (opiates) for pain -phenothiazines like chlorpromazine, mesoridazine, prochlorperazine, thioridazine This list may not describe all  possible interactions. Give your health care provider a list of all the medicines, herbs, non-prescription drugs, or dietary supplements you use. Also tell them if you smoke, drink alcohol, or use illegal drugs. Some items may interact with your medicine. What should I watch for while using this medicine? Visit your doctor or health care professional for regular checks on your progress. You may want to keep a record at home of how you feel your condition is responding to treatment. You may want to share this information with your doctor or health care professional at each visit. You should contact your doctor or health care professional if your seizures get worse or if you have any new types of seizures. Do not stop taking this medicine or any of your seizure medicines unless instructed by your doctor or health care professional. Stopping your medicine suddenly can increase your seizures or their severity. Wear a medical identification bracelet or chain if you are taking this medicine for seizures, and carry a card that lists all your medications. You may get drowsy, dizzy, or have blurred vision. Do not drive, use machinery, or do anything that needs mental alertness until you know how this medicine affects you. To reduce dizzy or fainting spells, do not sit or stand up quickly, especially if you are an older patient. Alcohol can increase drowsiness and  dizziness. Avoid alcoholic drinks. Your mouth may get dry. Chewing sugarless gum or sucking hard candy, and drinking plenty of water will help. The use of this medicine may increase the chance of suicidal thoughts or actions. Pay special attention to how you are responding while on this medicine. Any worsening of mood, or thoughts of suicide or dying should be reported to your health care professional right away. Women who become pregnant while using this medicine may enroll in the [place] Antiepileptic Drug Pregnancy Registry by calling  [phone]. This registry collects information about the safety of antiepileptic drug use during pregnancy. What side effects may I notice from receiving this medicine? Side effects that you should report to your doctor or health care professional as soon as possible: -allergic reactions like skin rash, itching or hives, swelling of the face, lips, or tongue -worsening of mood, thoughts or actions of suicide or dying Side effects that usually do not require medical attention (report to your doctor or health care professional if they continue or are bothersome): -constipation -difficulty walking or controlling muscle movements -dizziness -nausea -slurred speech -tiredness -tremors -weight gain This list may not describe all possible side effects. Call your doctor for medical advice about side effects. You may report side effects to FDA at 1-800-FDA-1088. Where should I keep my medicine? Keep out of reach of children. This medicine may cause accidental overdose and death if it taken by other adults, children, or pets. Mix any unused medicine with a substance like cat litter or coffee grounds. Then throw the medicine away in a sealed container like a sealed bag or a coffee can with a lid. Do not use the medicine after the expiration date. Store at room temperature between 15 and 30 degrees C (59 and 86 degrees F). NOTE: This sheet is a summary. It may not cover all possible information. If you have questions about this medicine, talk to your doctor, pharmacist, or health care provider.    2016, Elsevier/Gold Standard. (2013-07-15 15:26:50) Valproic Acid, Divalproex Sodium delayed or extended-release tablets What is this medicine? DIVALPROEX SODIUM (dye VAL pro ex SO dee um) is used to prevent seizures caused by some forms of epilepsy. It is also used to treat bipolar mania and to prevent migraine headaches. This medicine may be used for other purposes; ask your health care provider or  pharmacist if you have questions. What should I tell my health care provider before I take this medicine? They need to know if you have any of these conditions: -blood disease -brain damage or disease -kidney disease -liver disease -low blood proteins -mitochondrial disease -suicidal thoughts, plans, or attempt; a previous suicide attempt by you or a family member -urea cycle disorder (UCD) -an unusual or allergic reaction to divalproex sodium, other medicines, foods, dyes, or preservatives -pregnant or trying to get pregnant -breast-feeding How should I use this medicine? Take this medicine by mouth with a drink of water. Follow the directions on the prescription label. Do not crush or chew. If this medicine upsets your stomach, take it with food or milk. Take your medicine at regular intervals. Do not take it more often than directed. Talk to your pediatrician regarding the use of this medicine in children. Special care may be needed. Overdosage: If you think you have taken too much of this medicine contact a poison control center or emergency room at once. NOTE: This medicine is only for you. Do not share this medicine with others. What if I miss a  dose? If you miss a dose, take it as soon as you can. If it is almost time for your next dose, take only that dose. Do not take double or extra doses. What may interact with this medicine? -aspirin -barbiturates, like phenobarbital -diazepam -isoniazid -medicines for depression, anxiety, or psychotic disturbances -medicines that treat or prevent blood clots like warfarin -meropenem -other seizure medicines -rifampin -tolbutamide -zidovudine This list may not describe all possible interactions. Give your health care provider a list of all the medicines, herbs, non-prescription drugs, or dietary supplements you use. Also tell them if you smoke, drink alcohol, or use illegal drugs. Some items may interact with your medicine. What should I  watch for while using this medicine? Visit your doctor or health care professional for regular checks on your progress. If you are taking this medicine to treat epilepsy (seizures), do not stop taking it suddenly. This increases the risk of seizures. Wear a medical identification bracelet or chain to say you have epilepsy or seizures, and carry a card that lists all your medicines. You may get drowsy, dizzy, or have blurred vision. Do not drive, use machinery, or do anything that needs mental alertness until you know how this medicine affects you. To reduce dizzy or fainting spells, do not sit or stand up quickly, especially if you are an older patient. Alcohol can increase drowsiness and dizziness. Avoid alcoholic drinks. This medicine can cause blood problems. This can mean slow healing and a risk of infection. Problems can arise if you need dental work, and in the day to day care of your teeth. Try to avoid damage to your teeth and gums when you brush or floss your teeth. This medicine can make you more sensitive to the sun. Keep out of the sun. If you cannot avoid being in the sun, wear protective clothing and use sunscreen. Do not use sun lamps or tanning beds/booths. The use of this medicine may increase the chance of suicidal thoughts or actions. Pay special attention to how you are responding while on this medicine. Any worsening of mood, or thoughts of suicide or dying should be reported to your health care professional right away. Women who become pregnant while using this medicine may enroll in the [place] Antiepileptic Drug Pregnancy Registry by calling [phone]. This registry collects information about the safety of antiepileptic drug use during pregnancy. Contact your doctor or healthcare professional if you notice any part of your medicine in your stool. Your healthcare provider may want to check the amount of medicine in your blood if this happens. What side effects may I notice  from receiving this medicine? Side effects that you should report to your doctor or health care professional as soon as possible: -allergic reactions like skin rash, itching or hives, swelling of the face, lips, or tongue -changes in the frequency or severity of seizures -double vision or uncontrollable eye movements -nausea and vomiting -redness, blistering, peeling or loosening of the skin, including inside the mouth -stomach pain or cramps -trembling of hands or arms -unusual bleeding or bruising or pinpoint red spots on the skin -unusual swelling of the arms or legs -unusually weak or tired -worsening of mood, thoughts or actions of suicide or dying -yellowing of skin or eyes Side effects that usually do not require medical attention (report to your doctor or health care professional if they continue or are bothersome): -change in menstrual cycle -diarrhea or constipation -headache -loss of bladder control -loss of hair or unusual growth  of hair -loss or increase in appetite -weight gain or loss This list may not describe all possible side effects. Call your doctor for medical advice about side effects. You may report side effects to FDA at 1-800-FDA-1088. Where should I keep my medicine? Keep out of reach of children. Store at room temperature between 15 and 30 degrees C (59 and 86 degrees F). Keep container tightly closed. Throw away any unused medicine after the expiration date. NOTE: This sheet is a summary. It may not cover all possible information. If you have questions about this medicine, talk to your doctor, pharmacist, or health care provider.    2016, Elsevier/Gold Standard. (2012-01-13 11:50:42)

## 2015-10-25 ENCOUNTER — Ambulatory Visit: Payer: Managed Care, Other (non HMO) | Admitting: Adult Health

## 2015-10-26 ENCOUNTER — Encounter: Payer: Self-pay | Admitting: Adult Health

## 2016-02-18 ENCOUNTER — Telehealth: Payer: Self-pay | Admitting: Cardiovascular Disease

## 2016-02-18 NOTE — Telephone Encounter (Signed)
Received records from [facility] for appointment on 03/05/16 with Dr Duke Salviaandolph.  [facility] for Dr Leonides Sakeandolph's schedule on 03/05/16.

## 2016-03-04 NOTE — Progress Notes (Signed)
Cardiology Office Note   Date:  03/05/2016   ID:  Lauren [name], DOB 05-31-1966, MRN [id number]  PCP:  Lauren Bussing, MD  Cardiologist:   Lauren Si, MD   Chief Complaint  Patient presents with  . New Patient (Initial Visit)    Palpitations. Clearance for surgery.      History of Present Illness: Lauren [name] is a 50 y.o. female with hypertension and post-partum cardiomyopathy who presents for to re-establish care and for evaluation of palpitations.  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  She reports that after delivering her baby her ejection fraction reduced to 15%.  She also had the flu at the same time.  She subsequently had complete recovery of ventricular function.  She had PVCs when her ejection fraction was low, but this subsequently improved.  Over the last several months she has developed recurrent palpitations.  The symptoms occur somewhere between once per day and once per week and last for several seconds at a time.There is no associated chest pain or shortness of breath but she does start to feel panicked.  These episodes feel different than her prior PVCs.  Both her parents were recently diagnosed with atrial fibrillation and she is worried she may have this as well.  She saw Dr. Docia [name] 12/07/15 and reported episodes of palpitaitons but no other symtpoms.    Lauren [name] has L shoulder surgery upcoming 10/23 and her surgeon requested cardiac clearance.  She enercises by walking and running almost daily.  Palpitations are not any worse with exertion and she denies chest pain or shortness of breath with exertion.  She also denies lower extremity edema, orthopnea or PND.    Past Medical History:  Diagnosis Date  . 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 Cardiomyopathy    post-partum 2002  . Dizziness   . History of cardiomyopathy   . Iron deficiency anemia   . Menstrual migraine   . Pain of cervical spine   .  Palpitations   . Paresthesia   . Right ear pain     Past Surgical History:  Procedure Laterality Date  . echocardiogram stress test  12/30/10  . TRANSTHORACIC ECHOCARDIOGRAM  03/11/06     Current Outpatient Prescriptions  Medication Sig Dispense Refill  . glucosamine-chondroitin 500-400 MG tablet Take 1 tablet by mouth 3 (three) times daily.    . Magnesium 200 MG TABS Take 1 tablet by mouth 2 (two) times daily.    Marland Kitchen MELATONIN PO Take 3 mg by mouth as needed.     . SUMAtriptan (IMITREX) 100 MG tablet Take 1 tablet po for headache- may repeat in 2 hours if needed. 10 tablet 5   No current facility-administered medications for this visit.     Allergies:   Doxycycline    Social History:  The patient  reports that she quit smoking about 26 years ago. She has never used smokeless tobacco. She reports that she drinks alcohol. She reports that she does not use drugs.   Family History:  The patient's family history includes Atrial fibrillation in her father and mother; Cancer in her maternal grandmother and paternal grandmother; Diabetes in her father; Heart failure in her mother; Hypertension in her father; Peripheral Artery Disease in her father.    ROS:  Please see the history of present illness.   Otherwise, review of systems are positive for none.   All other systems are reviewed and negative.    PHYSICAL  EXAM: VS:  BP 130/84   Pulse 65   Ht 5' 3.5" (1.613 m)   Wt 118 lb 9.6 oz (53.8 kg)   BMI 20.68 kg/m  , BMI Body mass index is 20.68 kg/m. GENERAL:  Well appearing HEENT:  Pupils equal round and reactive, fundi not visualized, oral mucosa unremarkable NECK:  No jugular venous distention, waveform within normal limits, carotid upstroke brisk and symmetric, no bruits, no thyromegaly LYMPHATICS:  No cervical adenopathy LUNGS:  Clear to auscultation bilaterally HEART:  RRR.  PMI not displaced or sustained,S1 and S2 within normal limits, no S3, no S4, no clicks, no rubs, no  murmurs ABD:  Flat, positive bowel sounds normal in frequency in pitch, no bruits, no rebound, no guarding, no midline pulsatile mass, no hepatomegaly, no splenomegaly EXT:  2 plus pulses throughout, no edema, no cyanosis no clubbing SKIN:  No rashes no nodules NEURO:  Cranial nerves II through XII grossly intact, motor grossly intact throughout PSYCH:  Cognitively intact, oriented to person place and time    EKG:  EKG is ordered today. The ekg ordered today demonstrates Sinus rhythm. Rate 65 bpm. Occasional PACs. Cannot rule out prior septal infarct.   Recent Labs: No results found for requested labs within last 8760 hours.    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  She did not have any significant arrhythmias on EKG.  She is concerned about atrial fibrillation, which is reasonable given her parents both recently being diagnosed.  We will check a CBC, BMP and thyroid function.  We will also get a 7 day event monitor.  # Resolved systolic heart failure: There is no evidence of recurrent heart failure:  # Pre-operative risk assessment:  Lauren [name] plans to have shoulder surgery later this months.  She does not have any unstable cardiac conditions.  Upon evaluation today, she can achieve 4 METs or greater without anginal symptoms.  According to [facility] and AHA guidelines, she requires no further cardiac workup prior to her noncardiac surgery and should be at acceptable risk. Our service is available as necessary in the perioperative period.  # CV Disease Prevention: Check fasting lipids.   Current medicines are reviewed at length with the patient today.  The patient does not have concerns regarding  medicines.  The following changes have been made:  no change  Labs/ tests ordered today include:   Orders Placed This Encounter  Procedures  . T4, free  . TSH  . CBC with Differential/Platelet  . Magnesium  . Basic metabolic panel  . Lipid panel  . Cardiac event monitor  . EKG 12-Lead     Disposition:   FU with Abhijay Morriss C. Duke Salvia, MD, [facility] in 1 month.    This note was written with the assistance of speech recognition software.  Please excuse any transcriptional errors.  Signed, Shaquil Aldana C. Duke Salvia, MD, [facility]  03/05/2016 11:29 PM    [facility]

## 2016-03-05 ENCOUNTER — Encounter: Payer: Self-pay | Admitting: Cardiovascular Disease

## 2016-03-05 ENCOUNTER — Ambulatory Visit (INDEPENDENT_AMBULATORY_CARE_PROVIDER_SITE_OTHER): Payer: Managed Care, Other (non HMO) | Admitting: Cardiovascular Disease

## 2016-03-05 VITALS — BP 130/84 | HR 65 | Ht 63.5 in | Wt 118.6 lb

## 2016-03-05 DIAGNOSIS — I10 Essential (primary) hypertension: Secondary | ICD-10-CM | POA: Diagnosis not present

## 2016-03-05 DIAGNOSIS — R002 Palpitations: Secondary | ICD-10-CM | POA: Diagnosis not present

## 2016-03-05 NOTE — Patient Instructions (Addendum)
Medication Instructions:  Your physician recommends that you continue on your current medications as directed. Please refer to the Current Medication list given to you today.  Labwork: FASTING LP/BMET/CBC/MAGNESIUM/FT4/TSH WHEN YOU GET YOUR MONITOR  Testing/Procedures: Your physician has recommended that you wear an event monitor. Event monitors are medical devices that record the heart's electrical activity. Doctors most often us these monitors to diagnose arrhythmias. Arrhythmias are problems with the speed or rhythm of the heartbeat. The monitor is a small, portable device. You can wear one while you do your normal daily activities. This is usually used to diagnose what is causing palpitations/syncope (passing out). 7 DAY  [facility] AT [address]  Follow-Up: Your physician recommends that you schedule a follow-up appointment in: 1 MONTH OV  If you need a refill on your cardiac medications before your next appointment, please call your pharmacy.

## 2016-03-13 ENCOUNTER — Other Ambulatory Visit: Payer: Managed Care, Other (non HMO) | Admitting: *Deleted

## 2016-03-13 ENCOUNTER — Ambulatory Visit (INDEPENDENT_AMBULATORY_CARE_PROVIDER_SITE_OTHER): Payer: Managed Care, Other (non HMO)

## 2016-03-13 DIAGNOSIS — R002 Palpitations: Secondary | ICD-10-CM

## 2016-03-13 DIAGNOSIS — I10 Essential (primary) hypertension: Secondary | ICD-10-CM

## 2016-03-13 LAB — CBC WITH DIFFERENTIAL/PLATELET
BASOS ABS: 0 {cells}/uL (ref 0–200)
Basophils Relative: 0 %
EOS ABS: 43 {cells}/uL (ref 15–500)
Eosinophils Relative: 1 %
HEMATOCRIT: 42.5 % (ref 35.0–45.0)
HEMOGLOBIN: 14.4 g/dL (ref 11.7–15.5)
Lymphocytes Relative: 33 %
Lymphs Abs: 1419 cells/uL (ref 850–3900)
MCH: 30.9 pg (ref 27.0–33.0)
MCHC: 33.9 g/dL (ref 32.0–36.0)
MCV: 91.2 fL (ref 80.0–100.0)
MONOS PCT: 12 %
MPV: 9.4 fL (ref 7.5–12.5)
Monocytes Absolute: 516 cells/uL (ref 200–950)
NEUTROS ABS: 2322 {cells}/uL (ref 1500–7800)
Neutrophils Relative %: 54 %
PLATELETS: 243 10*3/uL (ref 140–400)
RBC: 4.66 MIL/uL (ref 3.80–5.10)
RDW: 13.3 % (ref 11.0–15.0)
WBC: 4.3 10*3/uL (ref 3.8–10.8)

## 2016-03-13 LAB — BASIC METABOLIC PANEL
BUN: 15 mg/dL (ref 7–25)
CHLORIDE: 103 mmol/L (ref 98–110)
CO2: 27 mmol/L (ref 20–31)
CREATININE: 0.61 mg/dL (ref 0.50–1.05)
Calcium: 9.1 mg/dL (ref 8.6–10.4)
GLUCOSE: 94 mg/dL (ref 65–99)
Potassium: 4.1 mmol/L (ref 3.5–5.3)
Sodium: 138 mmol/L (ref 135–146)

## 2016-03-13 LAB — T4, FREE: FREE T4: 1 ng/dL (ref 0.8–1.8)

## 2016-03-13 LAB — LIPID PANEL
CHOLESTEROL: 232 mg/dL — AB (ref 125–200)
HDL: 89 mg/dL (ref >=46)
LDL Cholesterol: 129 mg/dL (ref <130)
Total CHOL/HDL Ratio: 2.6 Ratio (ref <=5.0)
Triglycerides: 70 mg/dL (ref <150)
VLDL: 14 mg/dL (ref <30)

## 2016-03-13 LAB — MAGNESIUM: Magnesium: 2 mg/dL (ref 1.5–2.5)

## 2016-03-13 LAB — TSH: TSH: 0.87 mIU/L

## 2016-03-20 ENCOUNTER — Telehealth: Payer: Self-pay | Admitting: Cardiovascular Disease

## 2016-03-20 NOTE — Telephone Encounter (Signed)
Spoke to patient. I have made her aware that her monitor results are pending/in process.  I am unable to review a report for this, [facility] to make sure monitor results were received.

## 2016-03-20 NOTE — Telephone Encounter (Signed)
Pt calling for results of life vest-pls call

## 2016-03-21 NOTE — Telephone Encounter (Signed)
Monitor not placed here at [facility]. [facility].

## 2016-03-24 NOTE — Telephone Encounter (Signed)
7 day cardiac event monitor imported for Dr. Duke Salviaandolph to review.

## 2016-04-23 ENCOUNTER — Encounter: Payer: Self-pay | Admitting: *Deleted

## 2016-04-28 ENCOUNTER — Ambulatory Visit (INDEPENDENT_AMBULATORY_CARE_PROVIDER_SITE_OTHER): Payer: Managed Care, Other (non HMO) | Admitting: Cardiovascular Disease

## 2016-04-28 ENCOUNTER — Encounter: Payer: Self-pay | Admitting: Cardiovascular Disease

## 2016-04-28 DIAGNOSIS — E7800 Pure hypercholesterolemia, unspecified: Secondary | ICD-10-CM

## 2016-04-28 DIAGNOSIS — E785 Hyperlipidemia, unspecified: Secondary | ICD-10-CM

## 2016-04-28 DIAGNOSIS — I491 Atrial premature depolarization: Secondary | ICD-10-CM

## 2016-04-28 DIAGNOSIS — I493 Ventricular premature depolarization: Secondary | ICD-10-CM

## 2016-04-28 HISTORY — DX: Hyperlipidemia, unspecified: E78.5

## 2016-04-28 HISTORY — DX: Atrial premature depolarization: I49.1

## 2016-04-28 HISTORY — DX: Ventricular premature depolarization: I49.3

## 2016-04-28 NOTE — Patient Instructions (Signed)

## 2016-04-28 NOTE — Progress Notes (Signed)
Cardiology Office Note   Date:  04/28/2016   ID:  JAIDYN CONSOLI, DOB 06/17/1965, MRN [id number]  PCP:  Darrow Bussing, MD  Cardiologist:   Chilton Si, MD   Chief Complaint  Patient presents with  . Follow-up    History of Present Illness: DIANNE GRENIER is a 50 y.o. female with hypertension and post-partum cardiomyopathy (LVEF improved to 55%) who presents for follow up on palpitations.  Ms. 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  She reports that after delivering her baby her ejection fraction reduced to 15%.  She also had the flu at the same time.  She subsequently had complete recovery of ventricular function.  She had PVCs when her ejection fraction was low, but this subsequently improved.  Over the last several months she has developed recurrent palpitations.  She had a 7 day event monitor 03/2016 that revealed PACs and PVCs. She notes that her palpitations may be triggered by migraines. She underwent left shoulder surgery 03/24/16 without any complications. Last week she tripped and fell and broke her right foot. Other than these orthopedic issue she has been feeling well. She continues to have occasional palpitations but denies any chest pain or shortness of breath. She denies lower extremity edema, orthopnea, or PND.   Past Medical History:  Diagnosis Date  . 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 Cardiomyopathy    post-partum 2002  . Dizziness   . History of cardiomyopathy   . Hyperlipidemia 04/28/2016  . Iron deficiency anemia   . Menstrual migraine   . PAC (premature atrial contraction) 04/28/2016  . Pain of cervical spine   . Palpitations   . Paresthesia   . PVC (premature ventricular contraction) 04/28/2016  . Right ear pain     Past Surgical History:  Procedure Laterality Date  . echocardiogram stress test  12/30/10  . TRANSTHORACIC ECHOCARDIOGRAM  03/11/06     Current Outpatient Prescriptions  Medication Sig  Dispense Refill  . glucosamine-chondroitin 500-400 MG tablet Take 1 tablet by mouth 3 (three) times daily.    . Magnesium 200 MG TABS Take 1 tablet by mouth 2 (two) times daily.    Marland Kitchen MELATONIN PO Take 3 mg by mouth as needed.     . SUMAtriptan (IMITREX) 100 MG tablet Take 1 tablet po for headache- may repeat in 2 hours if needed. 10 tablet 5   No current facility-administered medications for this visit.     Allergies:   Doxycycline    Social History:  The patient  reports that she quit smoking about 26 years ago. She has never used smokeless tobacco. She reports that she drinks alcohol. She reports that she does not use drugs.   Family History:  The patient's family history includes Atrial fibrillation in her father and mother; CAD in her mother; Cancer in her maternal grandmother and paternal grandmother; Diabetes in her father; Heart failure in her mother; Hypertension in her father; Peripheral Artery Disease in her father.    ROS:  Please see the history of present illness.   Otherwise, review of systems are positive for none.   All other systems are reviewed and negative.    PHYSICAL EXAM: VS:  BP 128/90 (BP Location: Right Arm, Patient Position: Sitting, Cuff Size: Normal)   Pulse 66   Ht 5' 3.5" (1.613 m)   Wt 53.3 kg (117 lb 9.6 oz)   SpO2 100%   BMI 20.51 kg/m  , BMI Body mass index  is 20.51 kg/m. GENERAL:  Well appearing HEENT:  Pupils equal round and reactive, fundi not visualized, oral mucosa unremarkable NECK:  No jugular venous distention, waveform within normal limits, carotid upstroke brisk and symmetric, no bruits LYMPHATICS:  No cervical adenopathy LUNGS:  Clear to auscultation bilaterally HEART:  RRR.  PMI not displaced or sustained,S1 and S2 within normal limits, no S3, no S4, no clicks, no rubs, no murmurs ABD:  Flat, positive bowel sounds normal in frequency in pitch, no bruits, no rebound, no guarding, no midline pulsatile mass, no hepatomegaly, no  splenomegaly EXT:  2 plus pulses throughout, no edema, no cyanosis no clubbing SKIN:  No rashes no nodules NEURO:  Cranial nerves II through XII grossly intact, motor grossly intact throughout PSYCH:  Cognitively intact, oriented to person place and time   EKG:  EKG is not ordered today. The ekg ordered 03/05/16 demonstrates Sinus rhythm. Rate 65 bpm. Occasional PACs. Cannot rule out prior septal infarct.  7 Day Event Monitor 03/13/16:  Quality: Fair.  Baseline artifact. Predominant rhythm: sinus rhythm Average heart rate: 71 bpm  Pauses >2.5 seconds: 0  PVCs and PACs noted    Recent Labs: 03/13/2016: BUN 15; Creat 0.61; Hemoglobin 14.4; Magnesium 2.0; Platelets 243; Potassium 4.1; Sodium 138; TSH 0.87    Lipid Panel    Component Value Date/Time   CHOL 232 (H) 03/13/2016 0943   TRIG 70 03/13/2016 0943   HDL 89 03/13/2016 0943   CHOLHDL 2.6 03/13/2016 0943   VLDL 14 03/13/2016 0943   LDLCALC 129 03/13/2016 0943      Wt Readings from Last 3 Encounters:  04/28/16 53.3 kg (117 lb 9.6 oz)  03/05/16 53.8 kg (118 lb 9.6 oz)  07/30/15 56.1 kg (123 lb 9.6 oz)      ASSESSMENT AND PLAN:  # PAC/PVCs: Ms. Thibodaux had  Both PACs and PVCs on her event monitor. We discussed starting a nodal agent if she is not interested in taking her medication at this time.  Thyroid function, basic metabolic panel, and CBC were unremarkable.  # Resolved systolic heart failure: There is no evidence of recurrent heart failure:  # Hyperlipidemia: Ms. Moncion cholesterol levels are elevated. However, her ASCVD 10 year risk is 0.8%. Therefore we will not start any medication at this time. We did discuss the importance of exercise when her foot and shoulder heal. She will also continue to work on her diet.   Current medicines are reviewed at length with the patient today.  The patient does not have concerns regarding medicines.  The following changes have been made:  no change  Labs/ tests  ordered today include:   No orders of the defined types were placed in this encounter.    Disposition:   FU with Rayon Mcchristian C. Duke Salvia, MD, [facility] in 1 year.   This note was written with the assistance of speech recognition software.  Please excuse any transcriptional errors.  Signed, Ahyana Skillin C. Duke Salvia, MD, [facility]  04/28/2016 4:40 PM    [facility]

## 2016-05-29 ENCOUNTER — Other Ambulatory Visit: Payer: Self-pay | Admitting: Endocrinology

## 2016-05-29 DIAGNOSIS — E049 Nontoxic goiter, unspecified: Secondary | ICD-10-CM

## 2016-07-18 ENCOUNTER — Other Ambulatory Visit: Payer: Managed Care, Other (non HMO)

## 2016-08-05 ENCOUNTER — Telehealth: Payer: Self-pay | Admitting: Adult Health

## 2016-08-05 MED ORDER — SUMATRIPTAN SUCCINATE 100 MG PO TABS: ORAL_TABLET | ORAL | 0 refills | Status: DC

## 2016-08-05 NOTE — Telephone Encounter (Signed)
One month supply sent to pharmacy.  Must keep appt for continued refills.

## 2016-08-05 NOTE — Telephone Encounter (Signed)
Patient requesting refill of SUMAtriptan (IMITREX) 100 MG tablet. [facility] [place].

## 2016-08-05 NOTE — Addendum Note (Signed)
Addended by: Lindell SparKIRKMAN, MICHELLE C on: 08/05/2016 09:20 AM   Modules accepted: Orders

## 2016-09-09 ENCOUNTER — Encounter (INDEPENDENT_AMBULATORY_CARE_PROVIDER_SITE_OTHER): Payer: Self-pay

## 2016-09-09 ENCOUNTER — Ambulatory Visit (INDEPENDENT_AMBULATORY_CARE_PROVIDER_SITE_OTHER): Payer: Managed Care, Other (non HMO) | Admitting: Adult Health

## 2016-09-09 ENCOUNTER — Encounter: Payer: Self-pay | Admitting: Adult Health

## 2016-09-09 VITALS — BP 129/83 | HR 75 | Resp 14 | Ht 63.5 in | Wt 111.0 lb

## 2016-09-09 DIAGNOSIS — G43009 Migraine without aura, not intractable, without status migrainosus: Secondary | ICD-10-CM | POA: Diagnosis not present

## 2016-09-09 DIAGNOSIS — Z9289 Personal history of other medical treatment: Secondary | ICD-10-CM

## 2016-09-09 MED ORDER — SUMATRIPTAN 20 MG/ACT NA SOLN: NASAL | 5 refills | Status: DC

## 2016-09-09 NOTE — Patient Instructions (Signed)
Try Imitrex nasal spray Do not take imitrex tablet and nasal spray If your symptoms worsen or you develop new symptoms please let us know.   Sumatriptan nasal spray What is this medicine? SUMATRIPTAN (soo ma TRIP tan) is used to treat migraines with or without aura. An aura is a strange feeling or visual disturbance that warns you of an attack. It is not used to prevent migraines. This medicine may be used for other purposes; ask your health care provider or pharmacist if you have questions. COMMON BRAND NAME(S): Imitrex What should I tell my health care provider before I take this medicine? They need to know if you have any of these conditions: -circulation problems in fingers and toes -diabetes -heart disease -high blood pressure -high cholesterol -history of irregular heartbeat -history of stroke -kidney disease -liver disease -postmenopausal or surgical removal of uterus and ovaries -seizures -smoke tobacco -stomach or intestine problems -an unusual or allergic reaction to sumatriptan, other medicines, foods, dyes, or preservatives -pregnant or trying to get pregnant -breast-feeding How should I use this medicine? This medicine is for use in the nose. Follow the directions on the prescription label. This medicine is taken at the first symptoms of a migraine. It is not for everyday use. Do not take your medicine more often than directed. Talk to your pediatrician regarding the use of this medicine in children. Special care may be needed. Overdosage: If you think you have taken too much of this medicine contact a poison control center or emergency room at once. NOTE: This medicine is only for you. Do not share this medicine with others. What if I miss a dose? This does not apply; this medicine is not for regular use. What may interact with this medicine? Do not take this medicine with any of the following medicines: -cocaine -ergot alkaloids like dihydroergotamine, ergonovine,  ergotamine, methylergonovine -feverfew -MAOIs like Carbex, Eldepryl, Marplan, Nardil, and Parnate -other medicines for migraine headache like almotriptan, eletriptan, frovatriptan, naratriptan, rizatriptan, zolmitriptan -tryptophan This medicine may also interact with the following medications: -certain medicines for depression, anxiety, or psychotic disturbances This list may not describe all possible interactions. Give your health care provider a list of all the medicines, herbs, non-prescription drugs, or dietary supplements you use. Also tell them if you smoke, drink alcohol, or use illegal drugs. Some items may interact with your medicine. What should I watch for while using this medicine? Only take this medicine for a migraine headache. Take it if you get warning symptoms or at the start of a migraine attack. It is not for regular use to prevent migraine attacks. You may get drowsy or dizzy. Do not drive, use machinery, or do anything that needs mental alertness until you know how this medicine affects you. To reduce dizzy or fainting spells, do not sit or stand up quickly, especially if you are an older patient. Alcohol can increase drowsiness, dizziness and flushing. Avoid alcoholic drinks. Smoking cigarettes may increase the risk of heart-related side effects from using this medicine. If you take migraine medicines for 10 or more days a month, your migraines may get worse. Keep a diary of headache days and medicine use. Contact your healthcare professional if your migraine attacks occur more frequently. What side effects may I notice from receiving this medicine? Side effects that you should report to your doctor or health care professional as soon as possible: -allergic reactions like skin rash, itching or hives, swelling of the face, lips, or tongue -bloody or watery diarrhea -  hallucination, loss of contact with reality -pain, tingling, numbness in the face, hands, or  feet -seizures -signs and symptoms of a blood clot such as breathing problems; changes in vision; chest pain; severe, sudden headache; pain, swelling, warmth in the leg; trouble speaking; sudden numbness or weakness of the face, arm, or leg -signs and symptoms of a dangerous change in heartbeat or heart rhythm like chest pain; dizziness; fast or irregular heartbeat; palpitations, feeling faint or lightheaded; falls; breathing problems -signs and symptoms of a stroke like changes in vision; confusion; trouble speaking or understanding; severe headaches; sudden numbness or weakness of the face, arm, or leg; trouble walking; dizziness; loss of balance or coordination -stomach pain Side effects that usually do not require medical attention (report to your doctor or health care professional if they continue or are bothersome): -changes in taste -facial flushing -headache -muscle cramps -muscle pain -nausea, vomiting -weak or tired This list may not describe all possible side effects. Call your doctor for medical advice about side effects. You may report side effects to FDA at 1-800-FDA-1088. Where should I keep my medicine? Keep out of the reach of children. Store at room temperature between 2 and 30 degrees C (36 and 86 degrees F). Throw away any unused medicine after the expiration date. NOTE: This sheet is a summary. It may not cover all possible information. If you have questions about this medicine, talk to your doctor, pharmacist, or health care provider.  2018 Elsevier/Gold Standard (2015-06-21 12:43:05)

## 2016-09-09 NOTE — Progress Notes (Signed)
PATIENT: Lauren [name] DOB: Oct 26, 1965  REASON FOR VISIT: follow up- migraine HISTORY FROM: patient  HISTORY OF PRESENT ILLNESS: [name] is a 51 year old female with a history of sensory alterations in the arms and legs as well as migraines. She returns today for follow-up. She states that she has not had a menstrual cycle in over a year. She has approximately one headache a month. Her headaches always occur on the left side of the head. They can last up to 3-4 days. She does have photophobia and phonophobia as well as sensitivity to smell. She denies nausea or vomiting. Sh reports she typically takes half a tablet of Imitrex immediately and may repeat several hours later if needed. She states that it does help the severity but does not resolve the headache. She states that in the last 2 weeks she has noticed a vibration sensation in the right upper thigh. She states it' will occur for 2 minutes and then stop for 2 minutes. She denies any weakness in the legs. Denies any numbness or tingling. Denies any back pain. No change in the bowels or bladder. She reports that she has an appointment with her orthopedic physician this week. She returns today for an evaluation.  HISTORY 07/30/15: [name] is a 51 year old female with a history of sensory alterations in the arms and legs as well as migraines. She returns today for follow-up. At the last visit the patient did not want to resume Topamax. She has followed up with her OB/GYN and they do feel that she is in menopause. She is currently undergoing some test with them. The patient states that her headaches have gotten worse. She states that she'll go  3 months without a menstrual cycle and therefore will not have a headache. However this month she's had 3 cycles and has suffered with several headaches. She states that the location can vary but normally is on the left side of the head. She does have photophobia and phonophobia as well as  nausea. She also has some blurred vision. She is currently taking sumatriptan and that works well for her headaches. She does tend to use up all of her tablets before the end of the month. She denies any new neurological symptoms. She returns today for an evaluation.  HISTORY 01/09/14: Ms. Lauren [name] is a 51 year old female with a history of sensory alteration in her arms and legs and migraines. She currently takes treximet and Topamax for her headaches. She states that she has been having some side effects since taking the Topamax. She has aching feeling her hands and feet. At this time she no longer wants to take the Topamax nor any other medication. Patient's headache coincide with her menstrual cycle. She believes that she is premenopausal. Her menstrual cycle is not regular therefore she can have headaches throughout the month. She has an appointment with her OBGYN in the next few months.   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 She returns today for follow-up. Patient was started on Effexor and klonopin and that has helped her anxiety. The patient started taking magnesium and states that since then she only has about 7 days out of the month where she will have sensory alterations. Those consist of vibration sensation in the ankles, some numbness over knees, and tightening of muscles in the lower legs. Patient is no longer taking the propanolol- she is unsure why she stopped. She was given a prescription for  Topamax but never started because of the potential side effects. The patient currently has approximately 16 headaches a month however they are dull headaches. She uses the treximet frequently to prevent severe migraines. She is using up her medication (treximet) rapidly due to the amount of headaches she has. Patient states that noise and light bothers her but denies nausea or vomiting. Patient has a history of headaches since she was 51 years old. No  new medical issues since the last visit.   REVIEW OF SYSTEMS: Out of a complete 14 system review of symptoms, the patient complains only of the following symptoms, and all other reviewed systems are negative.  Palpitations, back pain, neck stiffness, headache, numbness  ALLERGIES: Allergies  Allergen Reactions  . Doxycycline Rash    HOME MEDICATIONS: Outpatient Medications Prior to Visit  Medication Sig Dispense Refill  . glucosamine-chondroitin 500-400 MG tablet Take 1 tablet by mouth 3 (three) times daily.    . Magnesium 200 MG TABS Take 1 tablet by mouth 2 (two) times daily.    Marland Kitchen MELATONIN PO Take 3 mg by mouth as needed.     . SUMAtriptan (IMITREX) 100 MG tablet Take 1 tablet po for headache- may repeat in 2 hours if needed. 10 tablet 0   No facility-administered medications prior to visit.     PAST MEDICAL HISTORY: Past Medical History:  Diagnosis Date  . 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 Cardiomyopathy    post-partum 2002  . Dizziness   . History of cardiomyopathy   . Hyperlipidemia 04/28/2016  . Iron deficiency anemia   . Menstrual migraine   . PAC (premature atrial contraction) 04/28/2016  . Pain of cervical spine   . Palpitations   . Paresthesia   . PVC (premature ventricular contraction) 04/28/2016  . Right ear pain     PAST SURGICAL HISTORY: Past Surgical History:  Procedure Laterality Date  . echocardiogram stress test  12/30/10  . TRANSTHORACIC ECHOCARDIOGRAM  03/11/06    FAMILY HISTORY: Family History  Problem Relation Age of Onset  . Cancer Maternal Grandmother     reproductive organs  . Cancer Paternal Grandmother   . Atrial fibrillation Mother   . Heart failure Mother   . CAD Mother   . Hypertension Father   . Atrial fibrillation Father   . Peripheral Artery Disease Father   . Diabetes Father     SOCIAL HISTORY: Social History   Social History  . Marital status: Married    Spouse name: Jonny Ruiz  . Number of  children: 2  . Years of education: 14   Occupational History  . Physical therapy assistant Prn With [employer]  .  Unemployed   Social History Main Topics  . Smoking status: Former Smoker    Quit date: 06/02/1989  . Smokeless tobacco: Never Used     Comment: started at 16, smoked less than 1 ppd; quit in 1990  . Alcohol use 0.0 oz/week     Comment: approximately 3-5 drinks/week   . Drug use: No  . Sexual activity: Yes    Partners: Male   Other Topics Concern  . Not on file   Social History Narrative   Patient is Married ([name]) and lives at home with her family.   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   Patient is left-handed.   Patient drinks one cup of coffee and two  cups per day.      PHYSICAL EXAM  Vitals:   09/09/16 0731  BP: 129/83  Pulse: 75  Resp: 14  Weight: 111 lb (50.3 kg)  Height: 5' 3.5" (1.613 m)   Body mass index is 19.35 kg/m.  Generalized: Well developed, in no acute distress   Neurological examination  Mentation: Alert oriented to time, place, history taking. Follows all commands speech and language fluent Cranial nerve II-XII: Pupils were equal round reactive to light. Extraocular movements were full, visual field were full on confrontational test. Facial sensation and strength were normal. Uvula tongue midline. Head turning and shoulder shrug  were normal and symmetric. Motor: The motor testing reveals 5 over 5 strength of all 4 extremities. Good symmetric motor tone is noted throughout.  Sensory: Sensory testing is intact to soft touch on all 4 extremities. Vibration sensation intact in all 4 shoulders. No evidence of extinction is noted.  Coordination: Cerebellar testing reveals good finger-nose-finger and heel-to-shin bilaterally.  Gait and station: Gait is normal. Tandem gait is normal. Romberg is negative. No drift is seen.  Reflexes: Deep tendon reflexes are symmetric  and normal bilaterally.   DIAGNOSTIC DATA (LABS, IMAGING, TESTING) - I reviewed patient records, labs, notes, testing and imaging myself where available.  Lab Results  Component Value Date   WBC 4.3 03/13/2016   HGB 14.4 03/13/2016   HCT 42.5 03/13/2016   MCV 91.2 03/13/2016   PLT 243 03/13/2016      Component Value Date/Time   NA 138 03/13/2016 0943   K 4.1 03/13/2016 0943   CL 103 03/13/2016 0943   CO2 27 03/13/2016 0943   GLUCOSE 94 03/13/2016 0943   BUN 15 03/13/2016 0943   CREATININE 0.61 03/13/2016 0943   CALCIUM 9.1 03/13/2016 0943   Lab Results  Component Value Date   CHOL 232 (H) 03/13/2016   HDL 89 03/13/2016   LDLCALC 129 03/13/2016   TRIG 70 03/13/2016   CHOLHDL 2.6 03/13/2016   No results found for: HGBA1C No results found for: VITAMINB12 Lab Results  Component Value Date   TSH 0.87 03/13/2016      ASSESSMENT AND PLAN 51 y.o. 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 here with:  1. migraine headaches 2. Sensory alteration  The patient is having approximately one headache a month. She will try Imitrex nasal spray to see if it offers her better relief for her headaches. If this is not beneficial she should let us know. I reviewed the side effects as Imitrex with the patient and gave her a handout. The patient has a history of sensory alterations. Now is having a vibration sensation in the right upper thigh. She has a follow-up appointment with her orthopedic this week. I advised that if this sensation did not resolve she should let us know. She will follow-up in 6 months with Dr.  Anne Hahn.     Butch Penny, MSN, NP-C 09/09/2016, 7:29 AM [facility] [address] [place], [place] [address] [phone]

## 2016-09-09 NOTE — Progress Notes (Signed)
I have read the note, and I agree with the clinical assessment and plan.  Lauren [name]   

## 2016-09-24 ENCOUNTER — Telehealth: Payer: Self-pay | Admitting: Adult Health

## 2016-09-24 MED ORDER — SUMATRIPTAN SUCCINATE 100 MG PO TABS: 100.0000 mg | ORAL_TABLET | Freq: Once | ORAL | 2 refills | Status: DC | PRN

## 2016-09-24 NOTE — Telephone Encounter (Signed)
I spoke to patient and she is aware of message below. I confirmed pharmacy.

## 2016-09-24 NOTE — Telephone Encounter (Signed)
Pt is calling re: SUMAtriptan (IMITREX) 20 MG/ACT nasal spray  She states that she was originally on the tablet but Aundra Millet wanted her to try the spray. Pt said the spray is not enough especially for really bad migraines so she would like a call to confirm if the pill form can be called in. Pt said the pharmacy got a message back from [facility] that pt is no longer a pt of [facility].  Please call

## 2016-09-24 NOTE — Addendum Note (Signed)
Addended by: Crisoforo Oxford on: 09/24/2016 04:42 PM   Modules accepted: Orders

## 2016-09-24 NOTE — Telephone Encounter (Signed)
Ok to call in tablet. Please advise patient that she should not take the tablet and the spray.

## 2016-12-18 ENCOUNTER — Telehealth: Payer: Self-pay | Admitting: Adult Health

## 2016-12-18 MED ORDER — SUMATRIPTAN SUCCINATE 100 MG PO TABS: 100.0000 mg | ORAL_TABLET | Freq: Once | ORAL | 2 refills | Status: DC | PRN

## 2016-12-18 NOTE — Telephone Encounter (Signed)
Done

## 2016-12-18 NOTE — Addendum Note (Signed)
Addended by: Guy BeginYOUNG, Bailei Buist S on: 12/18/2016 04:12 PM   Modules accepted: Orders

## 2016-12-18 NOTE — Telephone Encounter (Signed)
Patient called office requesting refill for SUMAtriptan (IMITREX) 100 MG tablet.  Pharm[facility]- [facility] [place].

## 2016-12-25 ENCOUNTER — Ambulatory Visit
Admission: RE | Admit: 2016-12-25 | Discharge: 2016-12-25 | Disposition: A | Discharge status: Home / Self Care | Payer: Managed Care, Other (non HMO) | Source: Ambulatory Visit | Attending: Endocrinology | Admitting: Endocrinology

## 2016-12-25 DIAGNOSIS — E049 Nontoxic goiter, unspecified: Secondary | ICD-10-CM

## 2017-03-16 ENCOUNTER — Encounter: Payer: Self-pay | Admitting: Neurology

## 2017-03-16 ENCOUNTER — Ambulatory Visit (INDEPENDENT_AMBULATORY_CARE_PROVIDER_SITE_OTHER): Payer: Managed Care, Other (non HMO) | Admitting: Neurology

## 2017-03-16 VITALS — BP 116/76 | HR 72 | Ht 63.5 in | Wt 112.0 lb

## 2017-03-16 DIAGNOSIS — G43839 Menstrual migraine, intractable, without status migrainosus: Secondary | ICD-10-CM

## 2017-03-16 DIAGNOSIS — R209 Unspecified disturbances of skin sensation: Secondary | ICD-10-CM | POA: Diagnosis not present

## 2017-03-16 MED ORDER — SUMATRIPTAN SUCCINATE 100 MG PO TABS: 100.0000 mg | ORAL_TABLET | Freq: Once | ORAL | 5 refills | Status: DC | PRN

## 2017-03-16 MED ORDER — ERENUMAB-AOOE 70 MG/ML ~~LOC~~ SOAJ: 140.0000 mg | SUBCUTANEOUS | 3 refills | Status: DC

## 2017-03-16 NOTE — Progress Notes (Signed)
Reason for visit: Migraine headache  Lauren [name] is an 51 y.o. female  History of present illness:  Lauren [name] is a 51 year old left-handed white female with a history of migraine headaches. The patient has 3 or 4 headaches a month, but the headaches generally will last about 4 days. The headaches do respond to Imitrex. The patient continues to have unusual sensations throughout the body, she may have a spongy sensation in the feet that may come and go, she may have a tight sensation in the left knee at times. She also reports over the last year that she has had painless muscle twitches that migrate about the body which may be in the arm or leg. They are unassociated with muscle spasms. The patient has not had any muscle weakness or fatigue or atrophy. The patient denies any balance issues. She returns to the office today for an evaluation. She works as a Transport planner, she does not usually miss work because of the headache.  Past Medical History:  Diagnosis Date  . 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 Cardiomyopathy    post-partum 2002  . Dizziness   . History of cardiomyopathy   . Hyperlipidemia 04/28/2016  . Iron deficiency anemia   . Menstrual migraine   . PAC (premature atrial contraction) 04/28/2016  . Pain of cervical spine   . Palpitations   . Paresthesia   . PVC (premature ventricular contraction) 04/28/2016  . Right ear pain     Past Surgical History:  Procedure Laterality Date  . echocardiogram stress test  12/30/10  . TRANSTHORACIC ECHOCARDIOGRAM  03/11/06    Family History  Problem Relation Age of Onset  . Cancer Maternal Grandmother        reproductive organs  . Cancer Paternal Grandmother   . Atrial fibrillation Mother   . Heart failure Mother   . CAD Mother   . Hypertension Father   . Atrial fibrillation Father   . Peripheral Artery Disease Father   . Diabetes Father     Social history:  reports that  she quit smoking about 27 years ago. She has never used smokeless tobacco. She reports that she drinks alcohol. She reports that she does not use drugs.    Allergies  Allergen Reactions  . Doxycycline Rash    Medications:  Prior to Admission medications   Medication Sig Start Date End Date Taking? Authorizing Provider  B Complex-Biotin-FA (B-COMPLEX PO) Take 1 Dose by mouth daily.   Yes [provider]  Cholecalciferol (VITAMIN D3 PO) Take 5,000 Units by mouth daily.   Yes [provider]  L-THEANINE PO Take 200 mg by mouth daily.   Yes [provider]  MAGNESIUM PO Take 500 mg by mouth daily.   Yes [provider]  MELATONIN PO Take 3 mg by mouth as needed.    Yes [provider]  SUMAtriptan (IMITREX) 100 MG tablet Take 1 tablet (100 mg total) by mouth once as needed for migraine. May repeat in 2 hours if headache persists or recurs. 12/18/16  Yes [place], Aundra Millet, NP  UNABLE TO FIND Take 1 Dose by mouth daily. Med Name: Menosense- 2-4 capsules daily   Yes [provider]    ROS:  Out of a complete 14 system review of symptoms, the patient complains only of the following symptoms, and all other reviewed systems are negative.  Blurred vision Palpitations of the heart Flushing Joint pain, muscle cramps, neck  stiffness Itching Headache, numbness, weakness  Blood pressure 116/76, pulse 72, height 5' 3.5" (1.613 m), weight 112 lb (50.8 kg).  Physical Exam  General: The patient is alert and cooperative at the time of the examination.  Skin: No significant peripheral edema is noted.   Neurologic Exam  Mental status: The patient is alert and oriented x 3 at the time of the examination. The patient has apparent normal recent and remote memory, with an apparently normal attention span and concentration ability.   Cranial nerves: Facial symmetry is present. Speech is normal, no aphasia or dysarthria is noted. Extraocular  movements are full. Visual fields are full.  Motor: The patient has good strength in all 4 extremities.  Sensory examination: Soft touch sensation is symmetric on the face, arms, and legs.  Coordination: The patient has good finger-nose-finger and heel-to-shin bilaterally.  Gait and station: The patient has a normal gait. Tandem gait is normal. Romberg is negative. No drift is seen.  Reflexes: Deep tendon reflexes are symmetric.   Assessment/Plan:  1. Migraine headache  2. Benign muscle twitches  3. Paresthesias  The patient has had a greater than 10 year history of unusual sensory alterations throughout the body that will come and go. These are likely to be benign. The migratory muscle twitches are also likely benign in nature, unassociated with muscle weakness or atrophy. The patient is still having about 12 headache days a month. She could potentially benefit from Aimovig, we will try this medication for her headache. A prescription was sent in for the Imitrex. The patient has been on propranolol, Effexor, Topamax, and Treximet in the past. She has also been on clonazepam and magnesium supplementation without benefit. She was offered gabapentin and Depakote in the past, but she did not wish to go on these medications. She will follow-up in about 6 months.  Marlan Palau MD 03/16/2017 8:45 AM  [facility] [address] [place], [place] [address]  Phone [phone] Fax [phone]

## 2017-03-17 ENCOUNTER — Telehealth: Payer: Self-pay | Admitting: *Deleted

## 2017-03-17 NOTE — Telephone Encounter (Signed)
Submitted PA Aimovig to covermymeds. Key: [id number]. Awaiting response.

## 2017-03-19 NOTE — Telephone Encounter (Signed)
PA Aimovig approved via [employer] per covermymeds.  Effective 03/19/2017-06/19/2017. Also received fax notification of approval from [employer]. [facility]ed notification to [facility] [place] at [phone]. Received fax confirmation.

## 2017-05-05 NOTE — Progress Notes (Signed)
Cardiology Office Note   Date:  05/06/2017   ID:  Lauren [name], DOB Aug 05, 1965, MRN [id number]  PCP:  Darrow Bussing, MD  Cardiologist:   Chilton Si, MD   Chief Complaint  Patient presents with  . Chest Pain    pt states some dull and sharps in her chest area     History of Present Illness: Lauren [name] is a 51 y.o. 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  She reports that after delivering her baby her ejection fraction reduced to 15%.  She also had the flu at the same time.  She subsequently had complete recovery of ventricular function.  She had PVCs when her ejection fraction was low, but this subsequently improved.   She was seen 03/2016 for recurrent palpitations.  She had a 7 day event monitor 03/2016 that revealed PACs and PVCs. She notes that her palpitations may be triggered by migraines.  At her last appointment we discussed starting a nodal agent but she didn't want any more medications.    Since her last appointment Lauren [name] has been feeling okay.  She continues to have intermittent episodes of palpitations.  These occur randomly.  She also had a few episodes of chest discomfort.  One night last week she woke up feeling like someone was pinching and twisting her chest.  While sitting in a chair she also had a few shocking sensations in her chest.  She reported a few dull pains also that occurred while sitting in a chair.  She denies acid reflux.  She has no exertional symptoms.  She exercises 3 or 4 days/week and has no chest pain or shortness of breath with this activity.  She has not noted any lower extremity edema, orthopnea, or PND.  She does note that she has been feeling stressed and anxious lately.  Her father recently had bypass surgery and she wanted to make sure that the symptoms were not related to her heart.   Past  Medical History:  Diagnosis Date  . 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 Cardiomyopathy    post-partum 2002  . Dizziness   . History of cardiomyopathy   . Hyperlipidemia 04/28/2016  . Iron deficiency anemia   . Menstrual migraine   . PAC (premature atrial contraction) 04/28/2016  . Pain of cervical spine   . Palpitations   . Paresthesia   . PVC (premature ventricular contraction) 04/28/2016  . Right ear pain     Past Surgical History:  Procedure Laterality Date  . echocardiogram stress test  12/30/10  . TRANSTHORACIC ECHOCARDIOGRAM  03/11/06     Current Outpatient Medications  Medication Sig Dispense Refill  . B Complex-Biotin-FA (B-COMPLEX PO) Take 1 Dose by mouth daily.    . Cholecalciferol (VITAMIN D3 PO) Take 5,000 Units by mouth daily.    Dorise Hiss (AIMOVIG 140 DOSE) 70 MG/ML SOAJ Inject 140 mg into the skin every 30 (thirty) days. 1 pen 3  . L-THEANINE PO Take 200 mg by mouth daily.    Marland Kitchen MAGNESIUM PO Take 500 mg by mouth daily.    Marland Kitchen MELATONIN PO Take 3 mg by mouth as needed.     . SUMAtriptan (IMITREX) 100 MG tablet Take 1 tablet (100 mg total) by mouth once as needed for migraine. May repeat in 2 hours if headache persists or recurs. 10  tablet 5  . metoprolol succinate (TOPROL XL) 25 MG 24 hr tablet Take 1 tablet (25 mg total) by mouth daily. 30 tablet 5   No current facility-administered medications for this visit.     Allergies:   Doxycycline    Social History:  The patient  reports that she quit smoking about 27 years ago. she has never used smokeless tobacco. She reports that she drinks alcohol. She reports that she does not use drugs.   Family History:  The patient's family history includes Atrial fibrillation in her father and mother; CAD in her mother; Cancer in her maternal grandmother and paternal grandmother; Diabetes in her father; Heart failure in her mother; Hypertension in her father; Peripheral Artery Disease in her  father.    ROS:  Please see the history of present illness.   Otherwise, review of systems are positive for none.   All other systems are reviewed and negative.    PHYSICAL EXAM: VS:  BP (!) 126/94   Pulse 66   Ht 5\' 4"  (1.626 m)   Wt 111 lb 12.8 oz (50.7 kg)   BMI 19.19 kg/m  , BMI Body mass index is 19.19 kg/m. GENERAL:  Well appearing HEENT: Pupils equal round and reactive, fundi not visualized, oral mucosa unremarkable NECK:  No jugular venous distention, waveform within normal limits, carotid upstroke brisk and symmetric, no bruits, no thyromegaly LUNGS:  Clear to auscultation bilaterally HEART:  RRR.  PMI not displaced or sustained,S1 and S2 within normal limits, no S3, no S4, no clicks, no rubs, no murmurs ABD:  Flat, positive bowel sounds normal in frequency in pitch, no bruits, no rebound, no guarding, no midline pulsatile mass, no hepatomegaly, no splenomegaly EXT:  2 plus pulses throughout, no edema, no cyanosis no clubbing SKIN:  No rashes no nodules NEURO:  Cranial nerves II through XII grossly intact, motor grossly intact throughout PSYCH:  Cognitively intact, oriented to person place and time   EKG:  EKG is ordered today. The ekg ordered 03/05/16 demonstrates Sinus rhythm. Rate 65 bpm. Occasional PACs. Cannot rule out prior septal infarct. 05/06/17: Sinus rhythm.  Rate 66 bpm.  Prior septal infarct.  7 Day Event Monitor 03/13/16:  Quality: Fair.  Baseline artifact. Predominant rhythm: sinus rhythm Average heart rate: 71 bpm  Pauses >2.5 seconds: 0 PVCs and PACs noted   Recent Labs: No results found for requested labs within last 8760 hours.    Lipid Panel    Component Value Date/Time   CHOL 232 (H) 03/13/2016 0943   TRIG 70 03/13/2016 0943   HDL 89 03/13/2016 0943   CHOLHDL 2.6 03/13/2016 0943   VLDL 14 03/13/2016 0943   LDLCALC 129 03/13/2016 0943      Wt Readings from Last 3 Encounters:  05/06/17 111 lb 12.8 oz (50.7 kg)  03/16/17 112 lb  (50.8 kg)  09/09/16 111 lb (50.3 kg)      ASSESSMENT AND PLAN:  # PAC/PVCs: Symptoms persist. Thyroid function, basic metabolic panel, and CBC were unremarkable.  She is willing to try metoprolol succinate 25mg  daily.   # Resolved systolic heart failure: There is no evidence of recurrent heart failure:  # Hyperlipidemia: Lauren [name] cholesterol levels are elevated. However, her ASCVD 10 year risk is 0.8%. Therefore we will not start any medication at this time. Continue exercise and dietary changes.   # Chest pain: Symptoms are not consistent with ischemia.  She exercises regularly with no symptoms.  I suspect it is more related  to anxiety.  We are starting metoprolol as above.  Current medicines are reviewed at length with the patient today.  The patient does not have concerns regarding medicines.  The following changes have been made:  no change  Labs/ tests ordered today include:   No orders of the defined types were placed in this encounter.    Disposition:   FU with Wofford Stratton C. Duke Salvia, MD, [facility] in 2 months.    This note was written with the assistance of speech recognition software.  Please excuse any transcriptional errors.  Signed, Kamauri Denardo C. Duke Salvia, MD, [facility]  05/06/2017 8:44 AM    [facility]

## 2017-05-06 ENCOUNTER — Encounter: Payer: Self-pay | Admitting: Cardiovascular Disease

## 2017-05-06 ENCOUNTER — Ambulatory Visit: Payer: Managed Care, Other (non HMO) | Admitting: Cardiovascular Disease

## 2017-05-06 VITALS — BP 126/94 | HR 66 | Ht 64.0 in | Wt 111.8 lb

## 2017-05-06 DIAGNOSIS — R0789 Other chest pain: Secondary | ICD-10-CM

## 2017-05-06 DIAGNOSIS — I10 Essential (primary) hypertension: Secondary | ICD-10-CM

## 2017-05-06 DIAGNOSIS — E7800 Pure hypercholesterolemia, unspecified: Secondary | ICD-10-CM | POA: Diagnosis not present

## 2017-05-06 DIAGNOSIS — I491 Atrial premature depolarization: Secondary | ICD-10-CM | POA: Diagnosis not present

## 2017-05-06 MED ORDER — METOPROLOL SUCCINATE ER 25 MG PO TB24: 25.0000 mg | ORAL_TABLET | Freq: Every day | ORAL | 5 refills | Status: DC

## 2017-05-06 NOTE — Patient Instructions (Signed)
Medication Instructions:  START METOPROLOL SUCC 25 MG DAILY   Labwork: NONE  Testing/Procedures: NONE  Follow-Up: Your physician recommends that you schedule a follow-up appointment in: 2 MONTH OV  If you need a refill on your cardiac medications before your next appointment, please call your pharmacy.

## 2017-05-29 ENCOUNTER — Other Ambulatory Visit: Payer: Self-pay

## 2017-05-29 ENCOUNTER — Emergency Department
Admission: EM | Admit: 2017-05-29 | Discharge: 2017-05-29 | Disposition: A | Discharge status: Home / Self Care | Payer: Managed Care, Other (non HMO) | Source: Home / Self Care | Attending: Family Medicine | Admitting: Family Medicine

## 2017-05-29 ENCOUNTER — Encounter: Payer: Self-pay | Admitting: Emergency Medicine

## 2017-05-29 DIAGNOSIS — M62838 Other muscle spasm: Secondary | ICD-10-CM

## 2017-05-29 LAB — COMPLETE METABOLIC PANEL WITH GFR
AG Ratio: 1.9 (calc) (ref 1.0–2.5)
ALT: 17 U/L (ref 6–29)
AST: 21 U/L (ref 10–35)
Albumin: 4.3 g/dL (ref 3.6–5.1)
Alkaline phosphatase (APISO): 46 U/L (ref 33–130)
BUN: 19 mg/dL (ref 7–25)
CO2: 30 mmol/L (ref 20–32)
Calcium: 9.7 mg/dL (ref 8.6–10.4)
Chloride: 102 mmol/L (ref 98–110)
Creat: 0.64 mg/dL (ref 0.50–1.05)
GFR, Est African American: 120 mL/min/{1.73_m2} (ref >=60)
GFR, Est Non African American: 103 mL/min/{1.73_m2} (ref >=60)
Globulin: 2.3 g/dL (calc) (ref 1.9–3.7)
Glucose, Bld: 100 mg/dL — ABNORMAL HIGH (ref 65–99)
Potassium: 3.7 mmol/L (ref 3.5–5.3)
Sodium: 139 mmol/L (ref 135–146)
Total Bilirubin: 0.6 mg/dL (ref 0.2–1.2)
Total Protein: 6.6 g/dL (ref 6.1–8.1)

## 2017-05-29 LAB — POCT CBC W AUTO DIFF (K'VILLE URGENT CARE)

## 2017-05-29 LAB — LIPASE: Lipase: 34 U/L (ref 7–60)

## 2017-05-29 NOTE — ED Notes (Signed)
Blood draw from left AC.  1 stick.  Tolerated well.

## 2017-05-29 NOTE — ED Triage Notes (Signed)
Epigastric Spasms, no pain, x 10 days intermittent worse in the past 48 hours.

## 2017-05-29 NOTE — ED Provider Notes (Signed)
[facility]    CSN: [id number] Arrival date & time: 05/29/17  1040     History   Chief Complaint Chief Complaint  Patient presents with  . Abdominal Pain    HPI Lauren [name] is a 51 y.o. female.   HPI Lauren [name] is a 51 y.o. female presenting to UC with c/o intermittent spasms in epigastric region the last 10 days. Over the last 1-2 days, spasms have been more frequent.  Pt believes the spasms are coming from her abdominal muscles more than internally but is concerned it is becoming more frequent.  Denies pain, nausea, diaphoresis.  Denies fever, chills, n/v/d. No acid reflux. Denies difficulty breathing.     Past Medical History:  Diagnosis Date  . 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 Cardiomyopathy    post-partum 2002  . Dizziness   . History of cardiomyopathy   . Hyperlipidemia 04/28/2016  . Iron deficiency anemia   . Menstrual migraine   . PAC (premature atrial contraction) 04/28/2016  . Pain of cervical spine   . Palpitations   . Paresthesia   . PVC (premature ventricular contraction) 04/28/2016  . Right ear pain     Patient Active Problem List   Diagnosis Date Noted  . Hyperlipidemia 04/28/2016  . PAC (premature atrial contraction) 04/28/2016  . PVC (premature ventricular contraction) 04/28/2016  . Headache, menstrual migraine 01/09/2014  . CHEST PAIN 06/06/2010  . ESSENTIAL HYPERTENSION, BENIGN 04/03/2010  . CARDIOMYOPATHY 11/15/2008  . PARESTHESIA 11/15/2008  . PALPITATIONS 11/15/2008    Past Surgical History:  Procedure Laterality Date  . echocardiogram stress test  12/30/10  . TRANSTHORACIC ECHOCARDIOGRAM  03/11/06    OB History    No data available       Home Medications    Prior to Admission medications   Medication Sig Start Date End Date Taking? Authorizing Provider  B Complex-Biotin-FA (B-COMPLEX PO) Take 1 Dose by mouth daily.    [provider]  Cholecalciferol (VITAMIN  D3 PO) Take 5,000 Units by mouth daily.    [provider]  Erenumab-aooe (AIMOVIG 140 DOSE) 70 MG/ML SOAJ Inject 140 mg into the skin every 30 (thirty) days. 03/16/17   York SpanielWillis, [name] K, MD  L-THEANINE PO Take 200 mg by mouth daily.    [provider]  MAGNESIUM PO Take 500 mg by mouth daily.    [provider]  MELATONIN PO Take 3 mg by mouth as needed.     [provider]  metoprolol succinate (TOPROL XL) 25 MG 24 hr tablet Take 1 tablet (25 mg total) by mouth daily. 05/06/17   Chilton Siandolph, Tiffany, MD  SUMAtriptan (IMITREX) 100 MG tablet Take 1 tablet (100 mg total) by mouth once as needed for migraine. May repeat in 2 hours if headache persists or recurs. 03/16/17   York SpanielWillis, [name] K, MD    Family History Family History  Problem Relation Age of Onset  . Cancer Maternal Grandmother        reproductive organs  . Cancer Paternal Grandmother   . Atrial fibrillation Mother   . Heart failure Mother   . CAD Mother   . Hypertension Father   . Atrial fibrillation Father   . Peripheral Artery Disease Father   . Diabetes Father     Social History Social History   Tobacco Use  . Smoking status: Former Smoker    Last attempt to quit: 06/02/1989    Years since quitting: 28.0  .  Smokeless tobacco: Never Used  . Tobacco comment: started at 16, smoked less than 1 ppd; quit in 1990  Substance Use Topics  . Alcohol use: Yes    Alcohol/week: 0.0 oz    Comment: approximately 3-5 drinks/week   . Drug use: No     Allergies   Doxycycline   Review of Systems Review of Systems  Constitutional: Negative for appetite change, chills, diaphoresis and fever.  Respiratory: Negative for cough and shortness of breath.   Cardiovascular: Negative for chest pain and palpitations.  Gastrointestinal: Negative for abdominal pain ( "spasms" in epigastric region"), diarrhea, nausea and vomiting.  Genitourinary: Negative for dysuria and frequency.  Musculoskeletal:  Negative for arthralgias, back pain and myalgias.  Skin: Negative for rash.     Physical Exam Triage Vital Signs ED Triage Vitals  Enc Vitals Group     BP 05/29/17 1101 (!) 145/90     Pulse Rate 05/29/17 1101 72     Resp --      Temp 05/29/17 1101 98.4 F (36.9 C)     Temp Source 05/29/17 1101 Oral     SpO2 05/29/17 1101 100 %     Weight 05/29/17 1102 116 lb (52.6 kg)     Height 05/29/17 1102 5\' 3"  (1.6 m)     Head Circumference --      Peak Flow --      Pain Score 05/29/17 1102 0     Pain Loc --      Pain Edu? --      Excl. in GC? --    No data found.  Updated Vital Signs BP (!) 145/90 (BP Location: Right Arm)   Pulse 72   Temp 98.4 F (36.9 C) (Oral)   Ht 5\' 3"  (1.6 m)   Wt 116 lb (52.6 kg)   LMP 10/13/2013   SpO2 100%   BMI 20.55 kg/m   Physical Exam  Constitutional: She is oriented to person, place, and time. She appears well-developed and well-nourished.  Non-toxic appearance. She does not appear ill. No distress.  HENT:  Head: Normocephalic and atraumatic.  Mouth/Throat: Oropharynx is clear and moist.  Eyes: EOM are normal.  Neck: Normal range of motion.  Cardiovascular: Normal rate and regular rhythm.  Pulmonary/Chest: Effort normal and breath sounds normal. No stridor. No respiratory distress. She has no wheezes. She has no rhonchi.  Abdominal: Soft. Normal appearance, normal aorta and bowel sounds are normal. She exhibits no distension, no fluid wave, no abdominal bruit, no pulsatile midline mass and no mass. There is no tenderness.  Epigastric region: faint twitching of abdominal muscles in epigastric region for about 5-10 seconds. Non-tender.   Musculoskeletal: Normal range of motion.  Neurological: She is alert and oriented to person, place, and time.  Skin: Skin is warm and dry.  Psychiatric: She has a normal mood and affect. Her behavior is normal.  Nursing note and vitals reviewed.    UC Treatments / Results  Labs (all labs ordered are  listed, but only abnormal results are displayed) Labs Reviewed  COMPLETE METABOLIC PANEL WITH GFR  LIPASE  POCT CBC W AUTO DIFF ([facility])    EKG  EKG Interpretation None       Radiology No results found.  Procedures Procedures (including critical care time)  Medications Ordered in UC Medications - No data to display   Initial Impression / Assessment and Plan / UC Course  I have reviewed the triage vital signs and the nursing notes.  Pertinent labs & imaging results that were available during my care of the patient were reviewed by me and considered in my medical decision making (see chart for details).     CBC: WNL CMP and Lipase pending, however, symptoms due appear due to abdominal muscles rather than internal gallbladder or pancreas Encouraged alternating cool and warm compresses. Acetaminophen and ibuprofen F/u with PCP next week if not improving  Discussed symptoms that warrant emergent care in the ED.    Final Clinical Impressions(s) / UC Diagnoses   Final diagnoses:  Spasm of abdominal muscles    ED Discharge Orders    None       Controlled Substance Prescriptions [place] Controlled Substance Registry consulted? Not Applicable   Rolla Platehelps, Keyshla Tunison O, PA-C 05/29/17 1320

## 2017-05-30 ENCOUNTER — Telehealth: Payer: Self-pay | Admitting: Emergency Medicine

## 2017-05-30 NOTE — Telephone Encounter (Signed)
Inquired about patient's status; encourage them to call with questions/concerns. Left additional message that her labs were all wnl.

## 2017-06-10 ENCOUNTER — Telehealth: Payer: Self-pay

## 2017-06-10 NOTE — Telephone Encounter (Signed)
We received a prior authorization request for this medication. I have completed and submitted the PA on Cover My Meds and should have a determination within 48-72 hours.  Cover My Meds Key: [id number]

## 2017-06-15 ENCOUNTER — Encounter: Payer: Self-pay | Admitting: Neurology

## 2017-06-15 NOTE — Telephone Encounter (Signed)
Received fax notification from [employer] that PA denied. Faxed appeal [facility] at [phone] to [facility]. Waiting on determination. Patient ID#: [id number]  Request ID#: [id number]

## 2017-06-25 NOTE — Telephone Encounter (Signed)
Called [employer] to check on status of appeal for Aimovig. Appeal received 06/15/17 and still under review. They have 30 days to review.

## 2017-07-08 NOTE — Telephone Encounter (Signed)
Called to check on status of appeal. Aware they have 30 day turn around time. Spoke with Trey PaulaJeff with [employer]. Still showing under review.  ID#: [id number]. Fax# for expedited appeal: [phone].

## 2017-07-15 NOTE — Telephone Encounter (Signed)
I called and spoke with Deseree with [employer] to check again on status of appeal. Appeal was also denied. She is faxing denial to [phone]. However, she states pt no longer insured under them. We should contact the patient to get updated insurance.

## 2017-07-15 NOTE — Telephone Encounter (Signed)
LVM for pt letting her know appeal also denied via [place] for appeal rx aimovig. Informed her we were told she is no longer insured under them and to contact us with her new insurance information. We could attempt to get Aimovig approved via her new insurance if she would like. Asked her to call back and let us know.

## 2017-07-21 NOTE — Telephone Encounter (Signed)
Tried calling pt again. Advised I left message last week and requesting her to call office back. Gave [facility] phone number.   She also needs to schedule f/u around 09/14/17 per CW,MD note. I noticed she did not have a f/u scheduled. She can see NP (she has seen Megan in the past).

## 2017-07-23 NOTE — Telephone Encounter (Signed)
IF patient calls back give her the last message per Marlis EdelsonEmma Rn , and Dr. Anne HahnWillis. Pt can see NP Aundra MilletMegan if she has a open appt around 09/14/2017.

## 2017-07-24 ENCOUNTER — Encounter: Payer: Self-pay | Admitting: Cardiovascular Disease

## 2017-07-24 ENCOUNTER — Ambulatory Visit (INDEPENDENT_AMBULATORY_CARE_PROVIDER_SITE_OTHER): Payer: Managed Care, Other (non HMO) | Admitting: Cardiovascular Disease

## 2017-07-24 VITALS — BP 118/84 | HR 76 | Ht 63.0 in | Wt 114.2 lb

## 2017-07-24 DIAGNOSIS — R002 Palpitations: Secondary | ICD-10-CM

## 2017-07-24 DIAGNOSIS — E7800 Pure hypercholesterolemia, unspecified: Secondary | ICD-10-CM | POA: Diagnosis not present

## 2017-07-24 DIAGNOSIS — I493 Ventricular premature depolarization: Secondary | ICD-10-CM | POA: Diagnosis not present

## 2017-07-24 DIAGNOSIS — I491 Atrial premature depolarization: Secondary | ICD-10-CM

## 2017-07-24 DIAGNOSIS — I10 Essential (primary) hypertension: Secondary | ICD-10-CM | POA: Diagnosis not present

## 2017-07-24 NOTE — Progress Notes (Signed)
Cardiology Office Note   Date:  07/24/2017   ID:  [name], DOB 07/08/1965, MRN [id number]  PCP:  Darrow Bussing, MD  Cardiologist:   Chilton Si, MD   Chief Complaint  Patient presents with  . Follow-up    pt denied chest pain     History of Present Illness: Lauren [name] is a 52 y.o. 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  She reports that after delivering her baby her ejection fraction reduced to 15%.  She also had the flu at the same time.  She subsequently had complete recovery of ventricular function.  She had PVCs when her ejection fraction was low, but this subsequently improved.   She was seen 03/2016 for recurrent palpitations.  She had a 7 day event monitor 03/2016 that revealed PACs and PVCs. She notes that her palpitations may be triggered by migraines.  At her last appointment she was started on metoprolol.  She also reported atypical chest pain that never occurred with exertion.  Since her last appointment [name] has been feeling okay.  She did not initially start taking the metoprolol.  However due to her muscle twitches and life stressors she decided to give it a try.  When she started the 25 mg dose it was too strong and she started feeling very tired.  She reduce this to 12.5 mg and her fatigue has improved.  She is unsure if it is helped her palpitations.  She still has them approximately 2 or 3 times per week.  Sometimes she feels PVCs but sometimes it lasts for 3-5 seconds.  She wonders if this is the same.  There is no associated chest pain, shortness of breath, lightheadedness, or dizziness.  She notes that she is been going through a lot of stress lately and is separating from her husband.  She recognizes that she is have a lot of anxiety lately.  [name] also reports intermittent episodes of leg pain.  It  occurs randomly when she is either sitting or standing.  She exercises by running, doing yoga, and lifting weights and has no leg pain with exertion.  Is been going on for the last 6 months and worse in the last 1-2 months.  It happens once or twice per week and last for 5-10 minutes.  She denies numbness, tingling, or weakness.   Past Medical History:  Diagnosis Date  . 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 Cardiomyopathy    post-partum 2002  . Dizziness   . History of cardiomyopathy   . Hyperlipidemia 04/28/2016  . Iron deficiency anemia   . Menstrual migraine   . PAC (premature atrial contraction) 04/28/2016  . Pain of cervical spine   . Palpitations   . Paresthesia   . PVC (premature ventricular contraction) 04/28/2016  . Right ear pain     Past Surgical History:  Procedure Laterality Date  . echocardiogram stress test  12/30/10  . TRANSTHORACIC ECHOCARDIOGRAM  03/11/06     Current Outpatient Medications  Medication Sig Dispense Refill  . B Complex-Biotin-FA (B-COMPLEX PO) Take 1 Dose by mouth daily.    . Cholecalciferol (VITAMIN D3 PO) Take 5,000 Units by mouth daily.    Marland Kitchen L-THEANINE PO Take 200 mg by mouth daily.    Marland Kitchen MAGNESIUM PO Take 500 mg by mouth daily.    Marland Kitchen  MELATONIN PO Take 3 mg by mouth as needed.     . metoprolol succinate (TOPROL-XL) 25 MG 24 hr tablet Take 12.5 mg by mouth daily.    . SUMAtriptan (IMITREX) 100 MG tablet Take 1 tablet (100 mg total) by mouth once as needed for migraine. May repeat in 2 hours if headache persists or recurs. 10 tablet 5   No current facility-administered medications for this visit.     Allergies:   Doxycycline    Social History:  The patient  reports that she quit smoking about 28 years ago. she has never used smokeless tobacco. She reports that she drinks alcohol. She reports that she does not use drugs.   Family History:  The patient's family history includes Atrial fibrillation in her father and  mother; CAD in her mother; Cancer in her maternal grandmother and paternal grandmother; Diabetes in her father; Heart failure in her mother; Hypertension in her father; Peripheral Artery Disease in her father.    ROS:  Please see the history of present illness.   Otherwise, review of systems are positive for none.   All other systems are reviewed and negative.    PHYSICAL EXAM: VS:  BP 118/84   Pulse 76   Ht 5\' 3"  (1.6 m)   Wt 114 lb 3.2 oz (51.8 kg)   LMP 10/13/2013   BMI 20.23 kg/m  , BMI Body mass index is 20.23 kg/m. GENERAL:  Well appearing HEENT: Pupils equal round and reactive, fundi not visualized, oral mucosa unremarkable NECK:  No jugular venous distention, waveform within normal limits, carotid upstroke brisk and symmetric, no bruits LUNGS:  Clear to auscultation bilaterally HEART:  RRR.  PMI not displaced or sustained,S1 and S2 within normal limits, no S3, no S4, no clicks, no rubs, no murmurs ABD:  Flat, positive bowel sounds normal in frequency in pitch, no bruits, no rebound, no guarding, no midline pulsatile mass, no hepatomegaly, no splenomegaly EXT:  2 plus pulses throughout, no edema, no cyanosis no clubbing SKIN:  No rashes no nodules NEURO:  Cranial nerves II through XII grossly intact, motor grossly intact throughout PSYCH:  Cognitively intact, oriented to person place and time   EKG:  EKG is not ordered today. The ekg ordered 03/05/16 demonstrates Sinus rhythm. Rate 65 bpm. Occasional PACs. Cannot rule out prior septal infarct. 05/06/17: Sinus rhythm.  Rate 66 bpm.  Prior septal infarct.  7 Day Event Monitor 03/13/16:  Quality: Fair.  Baseline artifact. Predominant rhythm: sinus rhythm Average heart rate: 71 bpm  Pauses >2.5 seconds: 0 PVCs and PACs noted   Recent Labs: 05/29/2017: ALT 17; BUN 19; Creat 0.64; Potassium 3.7; Sodium 139    Lipid Panel    Component Value Date/Time   CHOL 232 (H) 03/13/2016 0943   TRIG 70 03/13/2016 0943   HDL 89  03/13/2016 0943   CHOLHDL 2.6 03/13/2016 0943   VLDL 14 03/13/2016 0943   LDLCALC 129 03/13/2016 0943      Wt Readings from Last 3 Encounters:  07/24/17 114 lb 3.2 oz (51.8 kg)  05/29/17 116 lb (52.6 kg)  05/06/17 111 lb 12.8 oz (50.7 kg)      ASSESSMENT AND PLAN:  # PAC/PVCs: Symptoms persist. Thyroid function, basic metabolic panel, and CBC were unremarkable.  She is unsure whether metoprolol is helping, though she is not interested in trying any other medications at this time.  She admits that she is not taking it regularly.  Continue metoprolol succinate 12.5 mg daily for  now.  # Resolved systolic heart failure: There is no evidence of recurrent heart failure:  # Hyperlipidemia: Ms. Llerena cholesterol levels are elevated. However, her ASCVD 10 year risk is 0.8%. Therefore we will not start any medication at this time. Continue exercise and dietary changes.   # Chest pain: Resolved.  # Leg pain: Likely musculoskeletal or nerve pain.  She has no exertional symtpoms and good pulses.   Current medicines are reviewed at length with the patient today.  The patient does not have concerns regarding medicines.  The following changes have been made:  no change  Labs/ tests ordered today include:   No orders of the defined types were placed in this encounter.    Disposition:   FU with Clydine Parkison C. Duke Salvia, MD, [facility] in 6 months.    This note was written with the assistance of speech recognition software.  Please excuse any transcriptional errors.  Signed, Denarius Sesler C. Duke Salvia, MD, [facility]  07/24/2017 8:37 AM    [facility]

## 2017-07-24 NOTE — Patient Instructions (Signed)
Medication Instructions:  Continue current medications  If you need a refill on your cardiac medications before your next appointment, please call your pharmacy.  Labwork: None Ordered  Testing/Procedures: None Ordered  Follow-Up: Your physician wants you to follow-up in: 6 Months with Dr [place]. You should receive a reminder letter in the mail two months in advance. If you do not receive a letter, please call our office [phone].    Thank you for choosing [facility]!!       

## 2017-09-04 ENCOUNTER — Ambulatory Visit (INDEPENDENT_AMBULATORY_CARE_PROVIDER_SITE_OTHER): Payer: 59 | Admitting: Neurology

## 2017-09-04 ENCOUNTER — Encounter: Payer: Self-pay | Admitting: Neurology

## 2017-09-04 VITALS — BP 121/84 | HR 69 | Ht 63.0 in | Wt 116.0 lb

## 2017-09-04 DIAGNOSIS — R209 Unspecified disturbances of skin sensation: Secondary | ICD-10-CM | POA: Diagnosis not present

## 2017-09-04 DIAGNOSIS — G43839 Menstrual migraine, intractable, without status migrainosus: Secondary | ICD-10-CM

## 2017-09-04 NOTE — Progress Notes (Signed)
Reason for visit: Migraine headache  Lauren [name] is an 52 y.o. female  History of present illness:  Lauren [name] is a 52 year old left-handed white female with a history of migraine headache.  The patient has had a good improvement in her headache frequency, she recently was placed on Toprol taking one half of a 25 mg tablet daily.  The patient has had only about one headache in the last month.  She takes Imitrex when the headache comes on.  Previously, she was having greater than 12 headache days a month.  She was under a lot of stress associated with separation that occurred in the summer 2018.  The patient is doing better at this point.  Being on the beta-blocker has also improved her muscle twitches, she also has unusual sensations mainly in the right anterolateral thigh with vibration sensations.  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  The patient is physically active, she has not had any weakness with her workouts.  She denies any actual muscle cramps.  She returns to the office today for an evaluation.  Past Medical History:  Diagnosis Date  . 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 Cardiomyopathy    post-partum 2002  . Dizziness   . History of cardiomyopathy   . Hyperlipidemia 04/28/2016  . Iron deficiency anemia   . Menstrual migraine   . PAC (premature atrial contraction) 04/28/2016  . Pain of cervical spine   . Palpitations   . Paresthesia   . PVC (premature ventricular contraction) 04/28/2016  . Right ear pain     Past Surgical History:  Procedure Laterality Date  . echocardiogram stress test  12/30/10  . TRANSTHORACIC ECHOCARDIOGRAM  03/11/06    Family History  Problem Relation Age of Onset  . Cancer Maternal Grandmother        reproductive organs  . Cancer Paternal Grandmother   .  Atrial fibrillation Mother   . Heart failure Mother   . CAD Mother   . Hypertension Father   . Atrial fibrillation Father   . Peripheral Artery Disease Father   . Diabetes Father     Social history:  reports that she quit smoking about 28 years ago. She has never used smokeless tobacco. She reports that she drinks alcohol. She reports that she does not use drugs.    Allergies  Allergen Reactions  . Doxycycline Rash    Medications:  Prior to Admission medications   Medication Sig Start Date End Date Taking? Authorizing Provider  B Complex-Biotin-FA (B-COMPLEX PO) Take 1 Dose by mouth daily.   Yes [provider]  Cholecalciferol (VITAMIN D3 PO) Take 5,000 Units by mouth daily.   Yes [provider]  GAMMA AMINOBUTYRIC ACID PO Take 100-200 mg by mouth daily.   Yes [provider]  L-THEANINE PO Take 50-100 mg by mouth daily.    Yes [provider]  MAGNESIUM PO Take 500 mg by mouth daily.   Yes [provider]  metoprolol succinate (TOPROL-XL) 25 MG 24 hr tablet Take 12.5 mg by mouth daily.   Yes [provider]  OVER THE COUNTER MEDICATION 75-150 mg daily. Lemon balm   Yes [provider]  SUMAtriptan (IMITREX) 100 MG tablet Take 1 tablet (100 mg total) by mouth once as needed for migraine.  May repeat in 2 hours if headache persists or recurs. 03/16/17  Yes York SpanielWillis, [name] K, MD    ROS:  Out of a complete 14 system review of symptoms, the patient complains only of the following symptoms, and all other reviewed systems are negative.  Palpitations of the heart Flushing Insomnia, frequent waking Back pain, muscle cramps Dizziness, numbness Anxiety  Blood pressure 121/84, pulse 69, height 5\' 3"  (1.6 m), weight 116 lb (52.6 kg), last menstrual period 10/13/2013.  Physical Exam  General: The patient is alert and cooperative at the time of the examination.  Skin: No significant peripheral edema is  noted.   Neurologic Exam  Mental status: The patient is alert and oriented x 3 at the time of the examination. The patient has apparent normal recent and remote memory, with an apparently normal attention span and concentration ability.   Cranial nerves: Facial symmetry is present. Speech is normal, no aphasia or dysarthria is noted. Extraocular movements are full. Visual fields are full.  Motor: The patient has good strength in all 4 extremities.  Sensory examination: Soft touch sensation is symmetric on the face, arms, and legs.  Coordination: The patient has good finger-nose-finger and heel-to-shin bilaterally.  Gait and station: The patient has a normal gait. Tandem gait is normal. Romberg is negative. No drift is seen.  Reflexes: Deep tendon reflexes are symmetric.   Assessment/Plan:  1.  Benign fasciculation syndrome  2.  Benign sensory alterations  3.  Migraine headache  The patient has had better control of her headaches and improvement in her muscle fasciculations on Toprol.  She will continue the medication.  She will follow-up through this office in 1 year, sooner if needed.  The patient has a lot of anxiety related to her muscle twitches and sensory complaints that have been present for many years.  These likely represent a benign entity.   Lauren Palau. Keith Willis MD 09/04/2017 8:24 AM  [facility] [address] [place], [place] [id number]  Phone [phone] Fax [phone]

## 2017-11-12 ENCOUNTER — Ambulatory Visit: Payer: Managed Care, Other (non HMO) | Attending: Urology | Admitting: Physical Therapy

## 2017-11-12 ENCOUNTER — Other Ambulatory Visit: Payer: Self-pay

## 2017-11-12 DIAGNOSIS — M6281 Muscle weakness (generalized): Secondary | ICD-10-CM | POA: Insufficient documentation

## 2017-11-12 DIAGNOSIS — M62838 Other muscle spasm: Secondary | ICD-10-CM

## 2017-11-12 NOTE — Therapy (Signed)
[facility] [address], [address] [place], [place], [address] Phone: [phone]   Fax:  [phone]  Physical Therapy Evaluation  Patient Details  Name: Lauren [name] MRN: [id number] Date of Birth: 02/08/1966 Referring Provider: Crist FatHerrick, Benjamin W, MD   Encounter Date: 11/12/2017  PT End of Session - 11/12/17 1217    Visit Number  1    Date for PT Re-Evaluation  01/07/18    PT Start Time  0835    PT Stop Time  0920    PT Time Calculation (min)  45 min    Activity Tolerance  Patient tolerated treatment well    Behavior During Therapy  [facility] assessed/performed       Past Medical History:  Diagnosis Date  . 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 Cardiomyopathy    post-partum 2002  . Dizziness   . History of cardiomyopathy   . Hyperlipidemia 04/28/2016  . Iron deficiency anemia   . Menstrual migraine   . PAC (premature atrial contraction) 04/28/2016  . Pain of cervical spine   . Palpitations   . Paresthesia   . PVC (premature ventricular contraction) 04/28/2016  . Right ear pain     Past Surgical History:  Procedure Laterality Date  . echocardiogram stress test  12/30/10  . TRANSTHORACIC ECHOCARDIOGRAM  03/11/06    There were no vitals filed for this visit.   Subjective Assessment - 11/12/17 0840    Subjective  I am having pressure that feels like the bladder.  Six months ago having "bone pain" in pubic bone.  Having burning where the lichen sclerosis is.  Now using cream and that is feeling better.  Excessive force sometimes with BM.      Pertinent History  goes by "[name]"    Patient Stated Goals  Get rid of the bladder fullness    Currently in Pain?  No/denies         [facility] - 11/12/17 0001      Assessment   Medical Diagnosis  R30.0 (ICD-10-CM) - Dysuria; L43.9 (ICD-10-CM) - Lichen planus, unspecified    Referring Provider  Crist FatHerrick, Benjamin W, MD    Prior Therapy  No      Precautions   Precautions  None      Restrictions   Weight Bearing Restrictions  No      Balance Screen   Has the patient fallen in the past 6 months  No      Home Environment   Living Environment  Private residence    Living Arrangements  Alone      Prior Function   Level of Independence  Independent    Vocation  Full time employment    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  she has some Lt hip weakness.  Pt has tight levator ani muscles on left.  She has tight right QL.  Pt has weak pelvic floor contraction of 2/5.  she has fascial restrictions on left side of bladder and urethra.  Pt will benefit from skilled PT to address these impairments and return to full function and normal toileting activities.    History and Personal Factors relevant to plan of care:  lichen sclerosis    Clinical Presentation  Stable    Clinical Presentation due to:  pt is stable    Clinical Decision Making  Low    Rehab Potential  Excellent     PT Frequency  1x / week    PT Duration  8 weeks    PT Treatment/Interventions  ADLs/Self Care Home Management;Biofeedback;Cryotherapy;Electrical Stimulation;Moist Heat;Therapeutic exercise;Therapeutic activities;Neuromuscular re-education;Patient/family education;Manual techniques;Passive range of motion    PT Next Visit Plan  internal STM to fascial restrictions on left side, breathing and bulging with tactile cues or sitting on ball, lying supine, stretches (patient does yoga)    Consulted and Agree with Plan of Care  Patient       Patient will benefit from skilled therapeutic intervention in order to improve the following deficits and impairments:  Pain, Increased fascial restricitons, Increased muscle spasms, Decreased range of motion, Decreased strength  Visit Diagnosis: Other muscle spasm - Plan: PT plan of care cert/re-cert  Muscle weakness (generalized) - Plan: PT plan of care cert/re-cert     Problem List Patient Active Problem List   Diagnosis Date Noted  . Hyperlipidemia 04/28/2016  . PAC (premature atrial contraction) 04/28/2016  . PVC (premature ventricular contraction) 04/28/2016  . Headache, menstrual migraine 01/09/2014  . CHEST PAIN 06/06/2010  . ESSENTIAL HYPERTENSION, BENIGN 04/03/2010  . CARDIOMYOPATHY 11/15/2008  . PARESTHESIA 11/15/2008  . PALPITATIONS 11/15/2008    Vincente Poli, PT 11/12/2017, 1:28 PM  [facility] [address] [place], [place], [address] Phone: [phone]   Fax:  [phone]  Name: Lauren [name] MRN: [id number] Date of Birth: 03/09/1966  Pt demonstrates decreased hip ROM.  she has some Lt hip weakness.  Pt has tight levator ani muscles on left.  She has tight right QL.  Pt has weak pelvic floor contraction of 2/5.  she has fascial restrictions on left side of bladder and urethra.  Pt will benefit from skilled PT to address these impairments and return to full function and normal toileting activities.    History and Personal Factors relevant to plan of care:  lichen sclerosis    Clinical Presentation  Stable    Clinical Presentation due to:  pt is stable    Clinical Decision Making  Low    Rehab Potential  Excellent     PT Frequency  1x / week    PT Duration  8 weeks    PT Treatment/Interventions  ADLs/Self Care Home Management;Biofeedback;Cryotherapy;Electrical Stimulation;Moist Heat;Therapeutic exercise;Therapeutic activities;Neuromuscular re-education;Patient/family education;Manual techniques;Passive range of motion    PT Next Visit Plan  internal STM to fascial restrictions on left side, breathing and bulging with tactile cues or sitting on ball, lying supine, stretches (patient does yoga)    Consulted and Agree with Plan of Care  Patient       Patient will benefit from skilled therapeutic intervention in order to improve the following deficits and impairments:  Pain, Increased fascial restricitons, Increased muscle spasms, Decreased range of motion, Decreased strength  Visit Diagnosis: Other muscle spasm - Plan: PT plan of care cert/re-cert  Muscle weakness (generalized) - Plan: PT plan of care cert/re-cert     Problem List Patient Active Problem List   Diagnosis Date Noted  . Hyperlipidemia 04/28/2016  . PAC (premature atrial contraction) 04/28/2016  . PVC (premature ventricular contraction) 04/28/2016  . Headache, menstrual migraine 01/09/2014  . CHEST PAIN 06/06/2010  . ESSENTIAL HYPERTENSION, BENIGN 04/03/2010  . CARDIOMYOPATHY 11/15/2008  . PARESTHESIA 11/15/2008  . PALPITATIONS 11/15/2008    Vincente Poli, PT 11/12/2017, 1:28 PM  [facility] [address] [place], [place], [address] Phone: [phone]   Fax:  [phone]  Name: Lauren [name] MRN: [id number] Date of Birth: 03/09/1966

## 2017-11-12 NOTE — Patient Instructions (Signed)
STRETCHING THE PELVIC FLOOR MUSCLES NO DILATOR  Supplies . Vaginal lubricant - coconut oil or see list below for options . Mirror (optional) . Gloves (optional) Positioning . Start in a semi-reclined position with your head propped up. Bend your knees and place your thumb or finger at the vaginal opening. Procedure . Apply a moderate amount of lubricant on the outer skin of your vagina, the labia minora.  Apply additional lubricant to your finger. Marland Kitchen. Spread the skin away from the vaginal opening. Place the end of your finger at the opening. . Do a maximum contraction of the pelvic floor muscles. Tighten the vagina and the anus maximally and relax. . When you know they are relaxed, gently and slowly insert your finger into your vagina, directing your finger slightly downward, for 2-3 inches of insertion. . Relax and stretch the 6 o'clock position . Hold each stretch for _2 min__ and repeat __1_ time with rest breaks of _1__ seconds between each stretch. . Repeat the stretching in the 4 o'clock and 8 o'clock positions. . Total time should be _6__ minutes, _1__ x per day.  Note the amount of theme your were able to achieve and your tolerance to your finger in your vagina. . Once you have accomplished the techniques you may try them in standing with one foot resting on the tub, or in other positions.  This is a good stretch to do in the shower if you don't need to use lubricant.    Moisturizers . They are used in the vagina to hydrate the mucous membrane that make up the vaginal canal. . Designed to keep a more normal acid balance (ph) . Once placed in the vagina, it will last between two to three days.  . Use 2-3 times per week at bedtime and last longer than 60 min. . Ingredients to avoid is glycerin and fragrance, can increase chance of infection . Should not be used just before sex due to causing irritation . Most are gels administered either in a tampon-shaped applicator or as a vaginal  suppository. They are non-hormonal.   Types of Moisturizers . Leatrice JewelsLuvena- drug store . Vitamin E vaginal suppositories- Whole foods, Amazon . Moist Again . Coconut oil- can break down condoms . Karlton LemonJulva- amazon . Yes moisturizer- amazon . NeuEve Silk , NeuEve Silver for menopausal or over 65 (if have severe vaginal atrophy or cancer treatments use NeuEve Silk for  1 month than move to Home DepoteuEve Silver)- Dana Corporationmazon, ShapeConsultant.com.cyNeuve.com . Olive and Bee intimate cream- www.oliveandbee.com.au  Creams to use externally on the Vulva area  Marathon OilDesert Harvest Releveum (good for for cancer patients that had radiation to the area)- Guamamazon or Newell Rubbermaidwww.https://garcia-valdez.org/desertharvest.com  V-magic cream - amazon  Julva-amazon  Vital "V Wild Yam salve ( help moisturize and help with thinning vulvar area, does have Beeswax  The KrogerMoodMaid Botanical Pro-Meno Wild Yam Cream- Energy East Corporationmazon  Desert Harvest Gele   Things to avoid in the vaginal area . Do not use things to irritate the vulvar area . No lotions just specialized creams for the vulva area- Neogyn, V-magic, No soaps; can use Aveeno or Calendula cleanser if needed. Must be gentle . No deodorants . No douches . Good to sleep without underwear to let the vaginal area to air out . No scrubbing: spread the lips to let warm water rinse over labias and pat dry

## 2017-11-17 ENCOUNTER — Encounter: Payer: Managed Care, Other (non HMO) | Admitting: Physical Therapy

## 2017-11-25 ENCOUNTER — Ambulatory Visit: Payer: Managed Care, Other (non HMO) | Admitting: Physical Therapy

## 2017-11-25 ENCOUNTER — Encounter: Payer: Self-pay | Admitting: Physical Therapy

## 2017-11-25 DIAGNOSIS — M62838 Other muscle spasm: Secondary | ICD-10-CM

## 2017-11-25 DIAGNOSIS — M6281 Muscle weakness (generalized): Secondary | ICD-10-CM

## 2017-11-25 NOTE — Therapy (Signed)
[facility] [address], [address] [place], [place], [address] Phone: [phone]   Fax:  [phone]  Physical Therapy Treatment  Patient Details  Name: LARESA CRAMBLIT MRN: [id number] Date of Birth: 1965/07/02 Referring Provider: Crist Fat, MD   Encounter Date: 11/25/2017  PT End of Session - 11/25/17 0809    Visit Number  2    Date for PT Re-Evaluation  01/07/18    PT Start Time  0805    PT Stop Time  0845    PT Time Calculation (min)  40 min    Activity Tolerance  Patient tolerated treatment well    Behavior During Therapy  [facility] for tasks assessed/performed       Past Medical History:  Diagnosis Date  . 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 Cardiomyopathy    post-partum 2002  . Dizziness   . History of cardiomyopathy   . Hyperlipidemia 04/28/2016  . Iron deficiency anemia   . Menstrual migraine   . PAC (premature atrial contraction) 04/28/2016  . Pain of cervical spine   . Palpitations   . Paresthesia   . PVC (premature ventricular contraction) 04/28/2016  . Right ear pain     Past Surgical History:  Procedure Laterality Date  . echocardiogram stress test  12/30/10  . TRANSTHORACIC ECHOCARDIOGRAM  03/11/06    There were no vitals filed for this visit.  Subjective Assessment - 11/25/17 0808    Subjective  Still feeling the same.  I did the self massage but not sure what I am feeling there.    Patient Stated Goals  Get rid of the bladder fullness    Currently in Pain?  No/denies                       [facility] Adult PT Treatment/Exercise - 11/25/17 0001      Self-Care   Other Self-Care Comments   toileting techniques      Neuro Re-ed    Neuro Re-ed Details   sitting on ball circles and side to side - breathing and bulging pelic floor; diaphragmatic breathing with all stretches      Exercises   Exercises  Lumbar      Lumbar Exercises: Stretches   Piriformis Stretch  Right;Left;60 seconds    Other Lumbar Stretch Exercise  frog squat, happy baby - 3 x 30 sec      Lumbar Exercises: Quadruped   Madcat/Old Horse  5 reps hold with breathing      Manual Therapy   Manual Therapy  Internal Pelvic Floor    Manual therapy comments  pt informed and consent given to perform intenal soft tissue release    Internal Pelvic Floor  left > right - levator ani, coccygeus               PT Short Term Goals - 11/12/17 1228      PT SHORT TERM GOAL #1   Title  be independent in initial HEP    Time  4    Period  Weeks    Status  New    Target Date  12/10/17        PT Long Term Goals - 11/12/17 1228      PT LONG TERM GOAL #1   Title  be independent in advanced HEP    Time  8    Period  Weeks    Status  New  Target Date  01/07/18      PT LONG TERM GOAL #2   Title  reports no bladder discomfort and able to sleep throught the night    Time  8    Period  Weeks    Status  New    Target Date  01/07/18      PT LONG TERM GOAL #3   Title  ind with self massage techniques    Time  8    Period  Weeks    Status  New    Target Date  01/07/18      PT LONG TERM GOAL #4   Title  pt will report 50% reduction in low back tightness and soreness due to increased overall soft tissue length and control of pelvic floor and core    Time  8    Period  Weeks    Status  New    Target Date  01/07/18      PT LONG TERM GOAL #5   Title  ...            Plan - 11/25/17 9629    Clinical Impression Statement  Pt did well with diaphragmatic breathing.  She has some difficulty feeling the  pelvic muscle relaxing but helped with tactile cues and when cued to use the ball and in different positions.  She had good release with manual therapy to pelvic muscles.  Pt will benefit from skilled PT to continue working on muscle spasms    PT Treatment/Interventions  ADLs/Self Care Home Management;Biofeedback;Cryotherapy;Electrical Stimulation;Moist  Heat;Therapeutic exercise;Therapeutic activities;Neuromuscular re-education;Patient/family education;Manual techniques;Passive range of motion    PT Next Visit Plan  f/u on internal STM to fascial restrictions on left side, breathing and bulging with tactile cues or sitting on ball, lying supine, stretches, biofeedback for relax and contract pelvic floor    PT Home Exercise Plan   Access Code: [email or web address]     Recommended Other Services  eval 10/28/39; cert signed in timely manner    Consulted and Agree with Plan of Care  Patient       Patient will benefit from skilled therapeutic intervention in order to improve the following deficits and impairments:  Pain, Increased fascial restricitons, Increased muscle spasms, Decreased range of motion, Decreased strength  Visit Diagnosis: Other muscle spasm  Muscle weakness (generalized)     Problem List Patient Active Problem List   Diagnosis Date Noted  . Hyperlipidemia 04/28/2016  . PAC (premature atrial contraction) 04/28/2016  . PVC (premature ventricular contraction) 04/28/2016  . Headache, menstrual migraine 01/09/2014  . CHEST PAIN 06/06/2010  . ESSENTIAL HYPERTENSION, BENIGN 04/03/2010  . CARDIOMYOPATHY 11/15/2008  . PARESTHESIA 11/15/2008  . PALPITATIONS 11/15/2008    Vincente Poli, PT 11/25/2017, 8:58 AM  [facility] [address], [address] [place], [place], [address] Phone: [phone]   Fax:  [phone]  Name: JONTA CHIEN MRN: [id number] Date of Birth: [birth year]

## 2017-12-11 ENCOUNTER — Encounter: Payer: Self-pay | Admitting: Physical Therapy

## 2017-12-11 ENCOUNTER — Ambulatory Visit: Payer: Managed Care, Other (non HMO) | Attending: Urology | Admitting: Physical Therapy

## 2017-12-11 DIAGNOSIS — M6281 Muscle weakness (generalized): Secondary | ICD-10-CM

## 2017-12-11 DIAGNOSIS — M62838 Other muscle spasm: Secondary | ICD-10-CM | POA: Insufficient documentation

## 2017-12-11 NOTE — Therapy (Signed)
[facility] [address], [address] [place], [place], [address] Phone: [phone]   Fax:  [phone]  Physical Therapy Treatment  Patient Details  Name: Lauren [name] MRN: [id number] Date of Birth: April 16, 1966 Referring Provider: Crist Fat, MD   Encounter Date: 12/11/2017  PT End of Session - 12/11/17 0841    Visit Number  3    Date for PT Re-Evaluation  01/07/18    PT Start Time  0803    PT Stop Time  0837    PT Time Calculation (min)  34 min    Activity Tolerance  Patient tolerated treatment well    Behavior During Therapy  [facility] for tasks assessed/performed       Past Medical History:  Diagnosis Date  . 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 Cardiomyopathy    post-partum 2002  . Dizziness   . History of cardiomyopathy   . Hyperlipidemia 04/28/2016  . Iron deficiency anemia   . Menstrual migraine   . PAC (premature atrial contraction) 04/28/2016  . Pain of cervical spine   . Palpitations   . Paresthesia   . PVC (premature ventricular contraction) 04/28/2016  . Right ear pain     Past Surgical History:  Procedure Laterality Date  . echocardiogram stress test  12/30/10  . TRANSTHORACIC ECHOCARDIOGRAM  03/11/06    There were no vitals filed for this visit.  Subjective Assessment - 12/11/17 0839    Subjective  Pt states she feels like she is bearing down when urinating and has to get up 2-3x per night.  She feels she is emptying bladder when she goes.      Patient Stated Goals  Get rid of the bladder fullness    Currently in Pain?  No/denies                       [facility] Adult PT Treatment/Exercise - 12/11/17 0001      Lumbar Exercises: Stretches   Other Lumbar Stretch Exercise  happy baby with breathing      Manual Therapy   Manual therapy comments  lumbar and thoracic paraspinals, QL right side, left hamstrings       Trigger Point Dry Needling -  12/11/17 0911    Consent Given?  Yes    Muscles Treated Upper Body  Quadratus Lumborum lumbar multifidi right side; thoracic T10 multifidi    Muscles Treated Lower Body  Hamstring    Hamstring Response  Twitch response elicited;Palpable increased muscle length             PT Short Term Goals - 12/11/17 0845      PT SHORT TERM GOAL #1   Title  be independent in initial HEP    Status  On-going        PT Long Term Goals - 11/12/17 1228      PT LONG TERM GOAL #1   Title  be independent in advanced HEP    Time  8    Period  Weeks    Status  New    Target Date  01/07/18      PT LONG TERM GOAL #2   Title  reports no bladder discomfort and able to sleep throught the night    Time  8    Period  Weeks    Status  New    Target Date  01/07/18      PT LONG TERM GOAL #  3   Title  ind with self massage techniques    Time  8    Period  Weeks    Status  New    Target Date  01/07/18      PT LONG TERM GOAL #4   Title  pt will report 50% reduction in low back tightness and soreness due to increased overall soft tissue length and control of pelvic floor and core    Time  8    Period  Weeks    Status  New    Target Date  01/07/18      PT LONG TERM GOAL #5   Title  ...            Plan - 12/11/17 0841    Clinical Impression Statement  Pt has trigger points that released in lumbar and thoracic paraspinals on right side, right QL.  Pt had trigger points on left hamstrings that released with trigger point release.  Pt has not been doing stretches or self massage so it is difficulty to assess progress at this time after only 2 treatments.  Pt will benefit from skilled PT to work on improved muscle tone.    PT Treatment/Interventions  ADLs/Self Care Home Management;Biofeedback;Cryotherapy;Electrical Stimulation;Moist Heat;Therapeutic exercise;Therapeutic activities;Neuromuscular re-education;Patient/family education;Manual techniques;Passive range of motion    PT Next Visit Plan   f/u on internal STM to fascial restrictions on left side, biofeedback with breathing and bulging supine and sitting    PT Home Exercise Plan   Access Code: [email or web address]     Consulted and Agree with Plan of Care  Patient       Patient will benefit from skilled therapeutic intervention in order to improve the following deficits and impairments:  Pain, Increased fascial restricitons, Increased muscle spasms, Decreased range of motion, Decreased strength  Visit Diagnosis: Other muscle spasm  Muscle weakness (generalized)     Problem List Patient Active Problem List   Diagnosis Date Noted  . Hyperlipidemia 04/28/2016  . PAC (premature atrial contraction) 04/28/2016  . PVC (premature ventricular contraction) 04/28/2016  . Headache, menstrual migraine 01/09/2014  . CHEST PAIN 06/06/2010  . ESSENTIAL HYPERTENSION, BENIGN 04/03/2010  . CARDIOMYOPATHY 11/15/2008  . PARESTHESIA 11/15/2008  . PALPITATIONS 11/15/2008    Vincente Poli, PT 12/11/2017, 9:25 AM  [facility] [address] [place], [place], [address] Phone: [phone]   Fax:  [phone]  Name: Lauren [name] MRN: [id number] Date of Birth: 12/17/1965

## 2017-12-18 ENCOUNTER — Ambulatory Visit: Payer: Managed Care, Other (non HMO) | Admitting: Physical Therapy

## 2017-12-18 ENCOUNTER — Encounter: Payer: Self-pay | Admitting: Physical Therapy

## 2017-12-18 DIAGNOSIS — M6281 Muscle weakness (generalized): Secondary | ICD-10-CM

## 2017-12-18 DIAGNOSIS — M62838 Other muscle spasm: Secondary | ICD-10-CM | POA: Diagnosis not present

## 2017-12-18 NOTE — Therapy (Addendum)
[facility] [address], [address] [place], [place], [address] Phone: [phone]   Fax:  [phone]  Physical Therapy Treatment  Patient Details  Name: Lauren [name] MRN: [id number] Date of Birth: 15-May-1966 Referring Provider: Crist Fat, MD   Encounter Date: 12/18/2017  PT End of Session - 12/18/17 0826    Visit Number  4    Date for PT Re-Evaluation  01/07/18    PT Start Time  0814 arrived late    PT Stop Time  0852    PT Time Calculation (min)  38 min    Activity Tolerance  Patient tolerated treatment well    Behavior During Therapy  [facility] for tasks assessed/performed       Past Medical History:  Diagnosis Date  . 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 Cardiomyopathy    post-partum 2002  . Dizziness   . History of cardiomyopathy   . Hyperlipidemia 04/28/2016  . Iron deficiency anemia   . Menstrual migraine   . PAC (premature atrial contraction) 04/28/2016  . Pain of cervical spine   . Palpitations   . Paresthesia   . PVC (premature ventricular contraction) 04/28/2016  . Right ear pain     Past Surgical History:  Procedure Laterality Date  . echocardiogram stress test  12/30/10  . TRANSTHORACIC ECHOCARDIOGRAM  03/11/06    There were no vitals filed for this visit.  Subjective Assessment - 12/18/17 0854    Subjective  Pt states she still have the bloated feeling at night.  She has not been doing self massage.  She has been doing yoga.    Patient Stated Goals  Get rid of the bladder fullness    Currently in Pain?  No/denies                       [facility] Adult PT Treatment/Exercise - 12/18/17 0001      Neuro Re-ed    Neuro Re-ed Details   rest 8mV; hold 10 sec - 13.48mV; quick; 20 sec hold 12mV; rest 4.50mV      Manual Therapy   Manual therapy comments  pt informed and consent given to perform intenal soft tissue release    Internal Pelvic Floor   bilateral levator ani and coccygeus, transverse peroneus; tactile cues to relax with breathing               PT Short Term Goals - 12/11/17 0845      PT SHORT TERM GOAL #1   Title  be independent in initial HEP    Status  On-going        PT Long Term Goals - 11/12/17 1228      PT LONG TERM GOAL #1   Title  be independent in advanced HEP    Time  8    Period  Weeks    Status  New    Target Date  01/07/18      PT LONG TERM GOAL #2   Title  reports no bladder discomfort and able to sleep throught the night    Time  8    Period  Weeks    Status  New    Target Date  01/07/18      PT LONG TERM GOAL #3   Title  ind with self massage techniques    Time  8    Period  Weeks    Status  New  Target Date  01/07/18      PT LONG TERM GOAL #4   Title  pt will report 50% reduction in low back tightness and soreness due to increased overall soft tissue length and control of pelvic floor and core    Time  8    Period  Weeks    Status  New    Target Date  01/07/18      PT LONG TERM GOAL #5   Title  ...            Plan - 12/18/17 0848    Clinical Impression Statement  Patient was educated on self massage and reinforced importance of doing HEP.  Pt has muscle spasms in muscle throughout pelvic floor.  Resting tone ranged from 8-67mV with 5 being after doing some exercises.  Pt will benefit from skilled PT to learn how to more effectively relax muscles    PT Treatment/Interventions  ADLs/Self Care Home Management;Biofeedback;Cryotherapy;Electrical Stimulation;Moist Heat;Therapeutic exercise;Therapeutic activities;Neuromuscular re-education;Patient/family education;Manual techniques;Passive range of motion    PT Next Visit Plan  f/u on internal STM to fascial restrictions on left side, breathing and bulging supine    PT Home Exercise Plan   Access Code: [email or web address]     Consulted and Agree with Plan of Care  Patient       Patient will benefit from skilled therapeutic  intervention in order to improve the following deficits and impairments:  Pain, Increased fascial restricitons, Increased muscle spasms, Decreased range of motion, Decreased strength  Visit Diagnosis: Other muscle spasm  Muscle weakness (generalized)     Problem List Patient Active Problem List   Diagnosis Date Noted  . Hyperlipidemia 04/28/2016  . PAC (premature atrial contraction) 04/28/2016  . PVC (premature ventricular contraction) 04/28/2016  . Headache, menstrual migraine 01/09/2014  . CHEST PAIN 06/06/2010  . ESSENTIAL HYPERTENSION, BENIGN 04/03/2010  . CARDIOMYOPATHY 11/15/2008  . PARESTHESIA 11/15/2008  . PALPITATIONS 11/15/2008    Vincente Poli, PT 12/18/2017, 8:55 AM  [facility] [address], [address] [place], [place], [address] Phone: [phone]   Fax:  [phone]  Name: Lauren [name] MRN: [id number] Date of Birth: [birth year]  PHYSICAL THERAPY DISCHARGE SUMMARY  Visits from Start of Care: 4  Current functional level related to goals / functional outcomes: See above details   Remaining deficits: See above details   Education / Equipment: HEP  Plan: Patient agrees to discharge.  Patient goals were not met. Patient is being discharged due to the patient's request.  ?????    Vincente Poli, PT 01/05/18 8:15 AM

## 2018-01-06 ENCOUNTER — Ambulatory Visit: Payer: Managed Care, Other (non HMO) | Admitting: Physical Therapy

## 2018-01-25 ENCOUNTER — Telehealth: Payer: Self-pay | Admitting: Neurology

## 2018-01-25 MED ORDER — SUMATRIPTAN SUCCINATE 100 MG PO TABS: 100.0000 mg | ORAL_TABLET | Freq: Once | ORAL | 5 refills | Status: DC | PRN

## 2018-01-25 NOTE — Telephone Encounter (Signed)
Rx printed, waiting on MD signature 

## 2018-01-25 NOTE — Telephone Encounter (Signed)
Pt request refill for SUMAtriptan (IMITREX) 100 MG tablet sent to [facility]

## 2018-01-25 NOTE — Telephone Encounter (Signed)
Faxed printed/signed rx to [facility] at [phone]. Received fax confirmation.

## 2018-02-18 ENCOUNTER — Other Ambulatory Visit: Payer: Self-pay | Admitting: Family Medicine

## 2018-02-18 DIAGNOSIS — R1011 Right upper quadrant pain: Secondary | ICD-10-CM

## 2018-03-02 ENCOUNTER — Other Ambulatory Visit: Payer: Managed Care, Other (non HMO)

## 2018-03-11 ENCOUNTER — Ambulatory Visit
Admission: RE | Admit: 2018-03-11 | Discharge: 2018-03-11 | Disposition: A | Discharge status: Home / Self Care | Payer: 59 | Source: Ambulatory Visit | Attending: Family Medicine | Admitting: Family Medicine

## 2018-03-11 DIAGNOSIS — R1011 Right upper quadrant pain: Secondary | ICD-10-CM

## 2018-05-12 ENCOUNTER — Emergency Department (INDEPENDENT_AMBULATORY_CARE_PROVIDER_SITE_OTHER): Payer: 59

## 2018-05-12 ENCOUNTER — Other Ambulatory Visit: Payer: Self-pay

## 2018-05-12 ENCOUNTER — Emergency Department (INDEPENDENT_AMBULATORY_CARE_PROVIDER_SITE_OTHER)
Admission: EM | Admit: 2018-05-12 | Discharge: 2018-05-12 | Disposition: A | Discharge status: Home / Self Care | Payer: 59 | Source: Home / Self Care | Attending: Family Medicine | Admitting: Family Medicine

## 2018-05-12 ENCOUNTER — Encounter: Payer: Self-pay | Admitting: Emergency Medicine

## 2018-05-12 DIAGNOSIS — S60031A Contusion of right middle finger without damage to nail, initial encounter: Secondary | ICD-10-CM

## 2018-05-12 DIAGNOSIS — W230XXA Caught, crushed, jammed, or pinched between moving objects, initial encounter: Secondary | ICD-10-CM | POA: Diagnosis not present

## 2018-05-12 DIAGNOSIS — S6000XA Contusion of unspecified finger without damage to nail, initial encounter: Secondary | ICD-10-CM

## 2018-05-12 DIAGNOSIS — S6991XA Unspecified injury of right wrist, hand and finger(s), initial encounter: Secondary | ICD-10-CM

## 2018-05-12 DIAGNOSIS — S60041A Contusion of right ring finger without damage to nail, initial encounter: Secondary | ICD-10-CM | POA: Diagnosis not present

## 2018-05-12 MED ORDER — CEPHALEXIN 500 MG PO CAPS: 500.0000 mg | ORAL_CAPSULE | Freq: Two times a day (BID) | ORAL | 0 refills | Status: DC

## 2018-05-12 MED ORDER — METOPROLOL SUCCINATE ER 25 MG PO TB24: 12.5000 mg | ORAL_TABLET | Freq: Every day | ORAL | 0 refills | Status: DC

## 2018-05-12 NOTE — ED Triage Notes (Signed)
Pt c/o right hand pain and bruising after closing her fingers in the car door on Monday.

## 2018-05-12 NOTE — Discharge Instructions (Addendum)
Change dressings daily and apply Bacitracin ointment to fingernails.  Keep wounds clean and dry.  Return for any signs of infection (or follow-up with family doctor):  Increasing redness, swelling, pain, heat, drainage, etc.   May take Aleve, 2 tabs twice daily for pain.

## 2018-05-12 NOTE — Telephone Encounter (Signed)
ASSESSMENT AND PLAN:  # PAC/PVCs: Symptoms persist. Thyroid function, basic metabolic panel, and CBC were unremarkable.  She is unsure whether metoprolol is helping, though she is not interested in trying any other medications at this time.  She admits that she is not taking it regularly.  Continue metoprolol succinate 12.5 mg daily for now.

## 2018-05-12 NOTE — ED Provider Notes (Signed)
[facility]    CSN: [id number] Arrival date & time: 05/12/18  1358     History   Chief Complaint Chief Complaint  Patient presents with  . Hand Pain    HPI Lauren [name] is a 52 y.o. female.   Patient reports that a car door closed on the fingertips of her right 3rd and 4th fingers two days ago.  She has had persistent pain in fingertips and blood beneath fingernails.  She states that her Tdap is current.  The history is provided by the patient.  Hand Pain  This is a new problem. The current episode started 2 days ago. The problem occurs constantly. The problem has not changed since onset.Exacerbated by: contact. Nothing relieves the symptoms. Treatments tried: ice pack. The treatment provided mild relief.    Past Medical History:  Diagnosis Date  . 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 Cardiomyopathy    post-partum 2002  . Dizziness   . History of cardiomyopathy   . Hyperlipidemia 04/28/2016  . Iron deficiency anemia   . Menstrual migraine   . PAC (premature atrial contraction) 04/28/2016  . Pain of cervical spine   . Palpitations   . Paresthesia   . PVC (premature ventricular contraction) 04/28/2016  . Right ear pain     Patient Active Problem List   Diagnosis Date Noted  . Hyperlipidemia 04/28/2016  . PAC (premature atrial contraction) 04/28/2016  . PVC (premature ventricular contraction) 04/28/2016  . Headache, menstrual migraine 01/09/2014  . CHEST PAIN 06/06/2010  . ESSENTIAL HYPERTENSION, BENIGN 04/03/2010  . CARDIOMYOPATHY 11/15/2008  . PARESTHESIA 11/15/2008  . PALPITATIONS 11/15/2008    Past Surgical History:  Procedure Laterality Date  . echocardiogram stress test  12/30/10  . TRANSTHORACIC ECHOCARDIOGRAM  03/11/06    OB History   None      Home Medications    Prior to Admission medications   Medication Sig Start Date End Date Taking? Authorizing Provider  B Complex-Biotin-FA (B-COMPLEX PO)  Take 1 Dose by mouth daily.    [provider]  cephALEXin (KEFLEX) 500 MG capsule Take 1 capsule (500 mg total) by mouth 2 (two) times daily. 05/12/18   Lattie HawBeese, Caspian Deleonardis A, MD  Cholecalciferol (VITAMIN D3 PO) Take 5,000 Units by mouth daily.    [provider]  GAMMA AMINOBUTYRIC ACID PO Take 100-200 mg by mouth daily.    [provider]  L-THEANINE PO Take 50-100 mg by mouth daily.     [provider]  MAGNESIUM PO Take 500 mg by mouth daily.    [provider]  metoprolol succinate (TOPROL-XL) 25 MG 24 hr tablet Take 12.5 mg by mouth daily.    [provider]  OVER THE COUNTER MEDICATION 75-150 mg daily. Lemon balm    [provider]  SUMAtriptan (IMITREX) 100 MG tablet Take 1 tablet (100 mg total) by mouth once as needed for up to 1 dose for migraine. May repeat in 2 hours if headache persists or recurs once. 01/25/18   York SpanielWillis, [name] K, MD    Family History Family History  Problem Relation Age of Onset  . Cancer Maternal Grandmother        reproductive organs  . Cancer Paternal Grandmother   . Atrial fibrillation Mother   . Heart failure Mother   . CAD Mother   . Hypertension Father   . Atrial fibrillation Father   . Peripheral Artery Disease Father   . Diabetes Father  Social History Social History   Tobacco Use  . Smoking status: Former Smoker    Last attempt to quit: 06/02/1989    Years since quitting: 28.9  . Smokeless tobacco: Never Used  . Tobacco comment: started at 16, smoked less than 1 ppd; quit in 1990  Substance Use Topics  . Alcohol use: Yes    Alcohol/week: 0.0 standard drinks    Comment: approximately 3-5 drinks/week   . Drug use: No     Allergies   Doxycycline   Review of Systems Review of Systems  All other systems reviewed and are negative.    Physical Exam Triage Vital Signs ED Triage Vitals  Enc Vitals Group     BP 05/12/18 1417 136/84     Pulse Rate 05/12/18 1417 66      Resp --      Temp 05/12/18 1417 98 F (36.7 C)     Temp Source 05/12/18 1417 Oral     SpO2 05/12/18 1417 100 %     Weight 05/12/18 1418 118 lb (53.5 kg)     Height --      Head Circumference --      Peak Flow --      Pain Score 05/12/18 1418 8     Pain Loc --      Pain Edu? --      Excl. in GC? --    No data found.  Updated Vital Signs BP 136/84 (BP Location: Right Arm)   Pulse 66   Temp 98 F (36.7 C) (Oral)   Wt 53.5 kg   LMP 10/13/2013   SpO2 100%   BMI 20.90 kg/m   Visual Acuity Right Eye Distance:   Left Eye Distance:   Bilateral Distance:    Right Eye Near:   Left Eye Near:    Bilateral Near:     Physical Exam  Constitutional: She appears well-developed and well-nourished. No distress.  HENT:  Head: Normocephalic.  Eyes: Pupils are equal, round, and reactive to light. Conjunctivae are normal.  Cardiovascular: Normal rate.  Pulmonary/Chest: Effort normal.  Musculoskeletal:       Right hand: She exhibits tenderness and bony tenderness. She exhibits normal range of motion, normal two-point discrimination, normal capillary refill, no deformity, no laceration and no swelling. Normal sensation noted.       Hands: Fingernails of right 4th and 5th fingers have subungual hematomas present.  Distal phalanx of each finger has tenderness to palpation.  Minimal swelling and no erythema.   Good range of motion DIP joints.  Distal neurovascular function is intact.   Neurological: She is alert.  Skin: Skin is warm and dry.  Nursing note and vitals reviewed.    UC Treatments / Results  Labs (all labs ordered are listed, but only abnormal results are displayed) Labs Reviewed - No data to display  EKG None  Radiology Dg Finger Middle Right  Result Date: 05/12/2018 CLINICAL DATA:  Closed finger in door 2 days ago EXAM: RIGHT MIDDLE FINGER 2+V COMPARISON:  None. FINDINGS: Soft tissue swelling distal finger. Negative for fracture or arthropathy. IMPRESSION: Negative  for fracture. Electronically Signed   By: Marlan Palau M.D.   On: 05/12/2018 14:52   Dg Finger Ring Right  Result Date: 05/12/2018 CLINICAL DATA:  Ring finger pain after closing it in a car door 2 days ago. EXAM: RIGHT RING FINGER 2+V COMPARISON:  None. FINDINGS: There is no evidence of fracture or dislocation. There is no evidence of arthropathy  or other focal bone abnormality. Soft tissues are unremarkable. IMPRESSION: Negative. Electronically Signed   By: Obie Dredge M.D.   On: 05/12/2018 14:52    Procedures Procedures  Drain subungual hematomas.  Using sterile technique, prepped right 3rd and 4th fingernails with Betadine and alcohol.  Using a cordless fine-tip cautery device, burned 1mm diameter hole over each subungual hematoma.  Hematomas drained easily.  Applied Bacitracin and compression bandage.  Patient tolerated well.  Medications Ordered in UC Medications - No data to display  Initial Impression / Assessment and Plan / UC Course  I have reviewed the triage vital signs and the nursing notes.  Pertinent labs & imaging results that were available during my care of the patient were reviewed by me and considered in my medical decision making (see chart for details).    Begin empiric Keflex 500mg  BID.    Final Clinical Impressions(s) / UC Diagnoses   Final diagnoses:  Contusion of fingertip, initial encounter  Contusion of right middle finger without damage to nail, initial encounter  Contusion of right ring finger without damage to nail, initial encounter     Discharge Instructions     Change dressings daily and apply Bacitracin ointment to fingernails.  Keep wounds clean and dry.  Return for any signs of infection (or follow-up with family doctor):  Increasing redness, swelling, pain, heat, drainage, etc.   May take Aleve, 2 tabs twice daily for pain.    ED Prescriptions    Medication Sig Dispense Auth. Provider   cephALEXin (KEFLEX) 500 MG capsule Take 1  capsule (500 mg total) by mouth 2 (two) times daily. 14 capsule Lattie Haw, MD         Lattie Haw, MD 05/12/18 [phone]

## 2018-06-04 ENCOUNTER — Telehealth: Payer: Self-pay | Admitting: Cardiovascular Disease

## 2018-06-04 MED ORDER — METOPROLOL SUCCINATE ER 25 MG PO TB24: 12.5000 mg | ORAL_TABLET | Freq: Every day | ORAL | 0 refills | Status: DC

## 2018-06-04 NOTE — Telephone Encounter (Signed)
Refill sent to the pharmacy electronically.  

## 2018-06-04 NOTE — Telephone Encounter (Signed)
°*  STAT* If patient is at the pharmacy, call can be transferred to refill team.   1. Which medications need to be refilled? (please list name of each medication and dose if known)   metoprolol succinate (TOPROL-XL) 25 MG 24 hr tablet   2. Which pharmacy/location (including street and city if local pharmacy) is medication to be sent to?  [id number] - [place], [place] - [address]  3. Do they need a 30 day or 90 day supply?  30 days  Patient is out of medication

## 2018-06-29 ENCOUNTER — Ambulatory Visit: Payer: Managed Care, Other (non HMO) | Admitting: Cardiovascular Disease

## 2018-06-29 ENCOUNTER — Encounter (INDEPENDENT_AMBULATORY_CARE_PROVIDER_SITE_OTHER): Payer: Self-pay

## 2018-06-29 ENCOUNTER — Encounter: Payer: Self-pay | Admitting: Cardiovascular Disease

## 2018-06-29 VITALS — BP 121/79 | HR 54 | Ht 64.0 in | Wt 118.6 lb

## 2018-06-29 DIAGNOSIS — I493 Ventricular premature depolarization: Secondary | ICD-10-CM

## 2018-06-29 DIAGNOSIS — I491 Atrial premature depolarization: Secondary | ICD-10-CM | POA: Diagnosis not present

## 2018-06-29 DIAGNOSIS — Z5181 Encounter for therapeutic drug level monitoring: Secondary | ICD-10-CM | POA: Diagnosis not present

## 2018-06-29 DIAGNOSIS — I10 Essential (primary) hypertension: Secondary | ICD-10-CM | POA: Diagnosis not present

## 2018-06-29 LAB — COMPREHENSIVE METABOLIC PANEL
A/G RATIO: 2 (ref 1.2–2.2)
ALBUMIN: 4.5 g/dL (ref 3.8–4.9)
ALT: 17 IU/L (ref 0–32)
AST: 22 IU/L (ref 0–40)
Alkaline Phosphatase: 60 IU/L (ref 39–117)
BILIRUBIN TOTAL: 0.4 mg/dL (ref 0.0–1.2)
BUN/Creatinine Ratio: 23 (ref 9–23)
BUN: 16 mg/dL (ref 6–24)
CHLORIDE: 101 mmol/L (ref 96–106)
CO2: 25 mmol/L (ref 20–29)
Calcium: 10 mg/dL (ref 8.7–10.2)
Creatinine, Ser: 0.7 mg/dL (ref 0.57–1.00)
GFR calc non Af Amer: 100 mL/min/{1.73_m2} (ref >59)
GFR, EST AFRICAN AMERICAN: 115 mL/min/{1.73_m2} (ref >59)
GLOBULIN, TOTAL: 2.2 g/dL (ref 1.5–4.5)
Glucose: 86 mg/dL (ref 65–99)
POTASSIUM: 4.8 mmol/L (ref 3.5–5.2)
SODIUM: 140 mmol/L (ref 134–144)
TOTAL PROTEIN: 6.7 g/dL (ref 6.0–8.5)

## 2018-06-29 LAB — LIPID PANEL
CHOL/HDL RATIO: 2.8 ratio (ref 0.0–4.4)
Cholesterol, Total: 226 mg/dL — ABNORMAL HIGH (ref 100–199)
HDL: 81 mg/dL (ref >39)
LDL Calculated: 128 mg/dL — ABNORMAL HIGH (ref 0–99)
TRIGLYCERIDES: 83 mg/dL (ref 0–149)
VLDL Cholesterol Cal: 17 mg/dL (ref 5–40)

## 2018-06-29 MED ORDER — METOPROLOL SUCCINATE ER 25 MG PO TB24: 12.5000 mg | ORAL_TABLET | Freq: Every day | ORAL | 3 refills | Status: DC

## 2018-06-29 NOTE — Progress Notes (Signed)
Cardiology Office Note   Date:  06/29/2018   ID:  Lauren [name], DOB 1965-07-22, MRN [id number]  PCP:  Lauren Bussing, MD  Cardiologist:   Lauren Si, MD   No chief complaint on file.   History of Present Illness: Lauren [name] is a 53 y.o. 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  She reports that after delivering her baby her ejection fraction reduced to 15%.  She also had the flu at the same time.  She subsequently had complete recovery of ventricular function.  She had PVCs when her ejection fraction was low, but this subsequently improved.   She was seen 03/2016 for recurrent palpitations.  She had a 7 day event monitor 03/2016 that revealed PACs and PVCs. She notes that her palpitations may be triggered by migraines.  At her last appointment she was started on metoprolol.  She also reported atypical chest pain that never occurred with exertion.  Since her last appointment Ms. Lauren [name] has been feeling well.  She has palpitations approximately once per week.  They last 3 to 4 seconds and it feels as though her heart is fluttering.  There is no associated chest pain, shortness of breath, lightheadedness, or dizziness.  Overall she is doing well.  She does yoga twice per week and also walks or runs for exercise.  She has no exertional symptoms.  She denies lower extremity edema, orthopnea, or PND.  She ran out of her metoprolol for a week or 2 and noted that she had more migraines when off the medication.  She also had more benign fasciculations.   Past Medical History:  Diagnosis Date  . 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 Cardiomyopathy    post-partum 2002  . Dizziness   . History of cardiomyopathy   . Hyperlipidemia 04/28/2016  . Iron deficiency anemia   . Menstrual migraine   . PAC (premature atrial  contraction) 04/28/2016  . Pain of cervical spine   . Palpitations   . Paresthesia   . PVC (premature ventricular contraction) 04/28/2016  . Right ear pain     Past Surgical History:  Procedure Laterality Date  . echocardiogram stress test  12/30/10  . TRANSTHORACIC ECHOCARDIOGRAM  03/11/06     Current Outpatient Medications  Medication Sig Dispense Refill  . Cholecalciferol (VITAMIN D3 PO) Take 5,000 Units by mouth daily.    Marland Kitchen MAGNESIUM PO Take 500 mg by mouth daily.    . metoprolol succinate (TOPROL-XL) 25 MG 24 hr tablet Take 0.5 tablets (12.5 mg total) by mouth daily. 45 tablet 3  . OVER THE COUNTER MEDICATION 75-150 mg daily. Lemon balm    . SUMAtriptan (IMITREX) 100 MG tablet Take 1 tablet (100 mg total) by mouth once as needed for up to 1 dose for migraine. May repeat in 2 hours if headache persists or recurs once. 10 tablet 5   No current facility-administered medications for this visit.     Allergies:   Doxycycline    Social History:  The patient  reports that she quit smoking about 29 years ago. She has never used smokeless tobacco. She reports current alcohol use. She reports that she does not use drugs.   Family History:  The patient's family history includes Atrial fibrillation in her father and mother; CAD in her mother; Cancer in her maternal grandmother and paternal grandmother;  Diabetes in her father; Heart failure in her mother; Hypertension in her father; Peripheral Artery Disease in her father.    ROS:  Please see the history of present illness.   Otherwise, review of systems are positive for none.   All other systems are reviewed and negative.    PHYSICAL EXAM: VS:  BP 121/79   Pulse (!) 54   Ht 5\' 4"  (1.626 m)   Wt 118 lb 9.6 oz (53.8 kg)   LMP 10/13/2013   BMI 20.36 kg/m  , BMI Body mass index is 20.36 kg/m. GENERAL:  Well appearing HEENT: Pupils equal round and reactive, fundi not visualized, oral mucosa unremarkable NECK:  No jugular venous  distention, waveform within normal limits, carotid upstroke brisk and symmetric, no bruits LUNGS:  Clear to auscultation bilaterally HEART:  Bradycardia.  Regular rhythm.  PMI not displaced or sustained,S1 and S2 within normal limits, no S3, no S4, no clicks, no rubs, no murmurs ABD:  Flat, positive bowel sounds normal in frequency in pitch, no bruits, no rebound, no guarding, no midline pulsatile mass, no hepatomegaly, no splenomegaly EXT:  2 plus pulses throughout, no edema, no cyanosis no clubbing SKIN:  No rashes no nodules NEURO:  Cranial nerves II through XII grossly intact, motor grossly intact throughout PSYCH:  Cognitively intact, oriented to person place and time   EKG:  EKG is ordered today. The ekg ordered 03/05/16 demonstrates Sinus rhythm. Rate 65 bpm. Occasional PACs. Cannot rule out prior septal infarct. 05/06/17: Sinus rhythm.  Rate 66 bpm.  Prior septal infarct. 06/29/2018: Sinus bradycardia.  Rate 54 bpm.  Inferolateral T wave inversions.  7 Day Event Monitor 03/13/16:  Quality: Fair.  Baseline artifact. Predominant rhythm: sinus rhythm Average heart rate: 71 bpm  Pauses >2.5 seconds: 0 PVCs and PACs noted   Recent Labs: No results found for requested labs within last 8760 hours.    Lipid Panel    Component Value Date/Time   CHOL 232 (H) 03/13/2016 0943   TRIG 70 03/13/2016 0943   HDL 89 03/13/2016 0943   CHOLHDL 2.6 03/13/2016 0943   VLDL 14 03/13/2016 0943   LDLCALC 129 03/13/2016 0943      Wt Readings from Last 3 Encounters:  06/29/18 118 lb 9.6 oz (53.8 kg)  05/12/18 118 lb (53.5 kg)  09/04/17 116 lb (52.6 kg)      ASSESSMENT AND PLAN:  # PAC/PVCs: Stable and mostly controlled on metoprolol.  No changes.   # Resolved systolic heart failure: There is no evidence of recurrent heart failure: Continue metoprolol.   # Hyperlipidemia: Ms. Lauren [name] cholesterol levels are elevated. However, her ASCVD 10 year risk is 0.8%. She is due to have  lipids/CMP rechecked today.   Current medicines are reviewed at length with the patient today.  The patient does not have concerns regarding medicines.  The following changes have been made:  no change  Labs/ tests ordered today include:   Orders Placed This Encounter  Procedures  . Lipid panel  . Comprehensive metabolic panel  . EKG 12-Lead     Disposition:   FU with [place], [address] [facility] in 1 year.     Signed, Glennie Rodda C. Duke Salvia, MD, [facility]  06/29/2018 9:14 AM    [facility]

## 2018-06-29 NOTE — Patient Instructions (Addendum)
Medication Instructions:  Your physician recommends that you continue on your current medications as directed. Please refer to the Current Medication list given to you today.  If you need a refill on your cardiac medications before your next appointment, please call your pharmacy.   Lab work: LP/CMET TODAY   If you have labs (blood work) drawn today and your tests are completely normal, you will receive your results only by: . MyChart Message (if you have MyChart) OR . A paper copy in the mail If you have any lab test that is abnormal or we need to change your treatment, we will call you to review the results.  Testing/Procedures: NONE  Follow-Up: At [facility], you and your health needs are our priority.  As part of our continuing mission to provide you with exceptional heart care, we have created designated Provider Care Teams.  These Care Teams include your primary Cardiologist (physician) and Advanced Practice Providers (APPs -  Physician Assistants and Nurse Practitioners) who all work together to provide you with the care you need, when you need it. You will need a follow up appointment in 12 months.  Please call our office 2 months in advance to schedule this appointment.  You may see DR [place] or one of the following Advanced Practice Providers on your designated Care Team:   Luke Kilroy, PA-C Krista Kroeger, PA-C . Callie Goodrich, PA-C    

## 2018-09-07 ENCOUNTER — Ambulatory Visit: Payer: 59 | Admitting: Adult Health

## 2018-10-12 ENCOUNTER — Ambulatory Visit: Payer: 59 | Admitting: Adult Health

## 2018-10-12 ENCOUNTER — Ambulatory Visit: Payer: 59 | Admitting: Neurology

## 2019-03-15 ENCOUNTER — Telehealth: Payer: Self-pay | Admitting: Cardiovascular Disease

## 2019-03-15 NOTE — Telephone Encounter (Signed)
Pt called in she states that she has this "pressure/hot feeling on the right side of her chest(feels like this is on the inside)" it has been constant for the last few days, but not right now she states that her BP has been a little high lately and today it is 143/92 HR 50. She states that this does not happen when she runs or when she goes on her walks. She states that she started having a tooth ache today and c/o fever today (it went away) she went to [facility] and had a COVID test she states that the result will be back in a few days. She states that this feeling happens when she is at rest but not during exercise, she does not really "know how to explain this feeling". She takes her medications as ordered. She has appt with her PCP tomorrow and with Arnold Long, DNP Thursday. She will go to ER if she feels this is necessary. She will CB if sx return, Please advise

## 2019-03-16 NOTE — Telephone Encounter (Signed)
PT NOTIFIED she states that she had virtual appt today with PCP and he states that he does not know what to think  Of her sx either. She said that he will start by waiting for COVID test to come. She will CB if there is anything we can do for her

## 2019-03-16 NOTE — Telephone Encounter (Signed)
If it happens at rest and not stress, it is not her heart.  Keep PCP follow up as planned.  I don't recommend she come in the office with a COVID test pending and very low likelihood for cardiac symptoms.

## 2019-03-16 NOTE — Progress Notes (Deleted)
{Choose 1 Note Type (Telehealth Visit or Telephone Visit):[phone]}   Date:  03/16/2019   ID:  Elmer Sow, DOB [birth year], MRN [id number]  {Patient Location:[phone]::"Home"} {Provider Location:[phone]::"Home"}  PCP:  Darrow Bussing, MD  Cardiologist:  Dr.Lavon  Electrophysiologist:  None   Evaluation Performed:  {Choose Visit [phone]::"Follow-Up Visit"}  Chief Complaint:  ***  History of Present Illness:    MARLAINE YA is a 53 y.o. 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  Dr. Duke Salvia noted that when the patient had her baby in 2002 her EF was 15%.  The patient did wear a cardiac monitor on 03/2016 for recurrent palpitations and was found to have frequent PACs and PVCs, and noted that her palpitations were triggered by migraines.  She was last seen in the office by Dr. Duke Salvia on 06/29/2018 at which time she complained of palpitations which occurred about once a week.  She was continued on metoprolol.  Lipids and LFTs were completed.  The patient's levels were not high enough to require medication.  She was encouraged to be on a low-cholesterol diet and increase her exercise to 150 minutes/week.  The patient {does/does not:200015} have symptoms concerning for COVID-19 infection (fever, chills, cough, or new shortness of breath).    Past Medical History:  Diagnosis Date  . 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 Cardiomyopathy    post-partum 2002  . Dizziness   . History of cardiomyopathy   . Hyperlipidemia 04/28/2016  . Iron deficiency anemia   . Menstrual migraine   . PAC (premature atrial contraction) 04/28/2016  . Pain of cervical spine   . Palpitations   . Paresthesia   . PVC (premature ventricular contraction) 04/28/2016  . Right ear pain    Past Surgical History:  Procedure Laterality Date   . echocardiogram stress test  12/30/10  . TRANSTHORACIC ECHOCARDIOGRAM  03/11/06     No outpatient medications have been marked as taking for the 03/17/19 encounter (Appointment) with Jodelle Gross, NP.     Allergies:   Doxycycline   Social History   Tobacco Use  . Smoking status: Former Smoker    Quit date: 06/02/1989    Years since quitting: 29.8  . Smokeless tobacco: Never Used  . Tobacco comment: started at 16, smoked less than 1 ppd; quit in 1990  Substance Use Topics  . Alcohol use: Yes    Alcohol/week: 0.0 standard drinks    Comment: approximately 3-5 drinks/week   . Drug use: No     Family Hx: The patient's family history includes Atrial fibrillation in her father and mother; CAD in her mother; Cancer in her maternal grandmother and paternal grandmother; Diabetes in her father; Heart failure in her mother; Hypertension in her father; Peripheral Artery Disease in her father.  ROS:   Please see the history of present illness.    *** All other systems reviewed and are negative.   Prior CV studies:   The following studies were reviewed today:  Echocardiogram 01/08/2011  Stress ECG conclusions: The stress ECG was normal.  - Baseline: LV global systolic function was normal. The estimated LV  ejection fraction was 55%. Normal wall motion; no LV regional wall  motion abnormalities.  - Low dose: LV global systolic function was normal. Normal wall  motion; no LV regional wall motion abnormalities.  - Peak stress: LV global systolic function was  vigorous. Normal wall  motion; no LV regional wall motion abnormalities.  - Recovery: LV global systolic function was normal. Normal wall  motion; no LV regional wall mot  Labs/Other Tests and Data Reviewed:    EKG:  {EKG/Telemetry Strips Reviewed:[phone]}  Recent Labs: 06/29/2018: ALT 17; BUN 16; Creatinine, Ser 0.70; Potassium 4.8; Sodium 140   Recent Lipid Panel Lab Results  Component Value Date/Time    CHOL 226 (H) 06/29/2018 09:52 AM   TRIG 83 06/29/2018 09:52 AM   HDL 81 06/29/2018 09:52 AM   CHOLHDL 2.8 06/29/2018 09:52 AM   CHOLHDL 2.6 03/13/2016 09:43 AM   LDLCALC 128 (H) 06/29/2018 09:52 AM    Wt Readings from Last 3 Encounters:  06/29/18 118 lb 9.6 oz (53.8 kg)  05/12/18 118 lb (53.5 kg)  09/04/17 116 lb (52.6 kg)     Objective:    Vital Signs:  LMP 10/13/2013    {[facility] Virtual Exam (Optional):[phone]::"VITAL SIGNS:  reviewed"}  ASSESSMENT & PLAN:    1. ***  COVID-19 Education: The signs and symptoms of COVID-19 were discussed with the patient and how to seek care for testing (follow up with PCP or arrange E-visit).  ***The importance of social distancing was discussed today.  Time:   Today, I have spent *** minutes with the patient with telehealth technology discussing the above problems.     Medication Adjustments/Labs and Tests Ordered: Current medicines are reviewed at length with the patient today.  Concerns regarding medicines are outlined above.   Tests Ordered: No orders of the defined types were placed in this encounter.   Medication Changes: No orders of the defined types were placed in this encounter.   Disposition:  Follow up {follow up:15908}  Signed, Bettey Mare. Liborio Nixon, ANP, AACC  03/16/2019 12:53 PM    [facility]

## 2019-03-17 ENCOUNTER — Telehealth: Payer: Managed Care, Other (non HMO) | Admitting: Adult Health

## 2019-03-24 ENCOUNTER — Other Ambulatory Visit: Payer: Self-pay | Admitting: Family Medicine

## 2019-03-24 ENCOUNTER — Ambulatory Visit
Admission: RE | Admit: 2019-03-24 | Discharge: 2019-03-24 | Disposition: A | Discharge status: Home / Self Care | Payer: 59 | Source: Ambulatory Visit | Attending: Family Medicine | Admitting: Family Medicine

## 2019-03-24 DIAGNOSIS — R0789 Other chest pain: Secondary | ICD-10-CM

## 2019-11-24 NOTE — Progress Notes (Signed)
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  Past Medical History    Past Medical History:  Diagnosis Date  . 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 Cardiomyopathy    post-partum 2002  . Dizziness   . History of cardiomyopathy   . Hyperlipidemia 04/28/2016  . Iron deficiency anemia   . Menstrual migraine   . PAC (premature atrial contraction) 04/28/2016  . Pain of cervical spine   . Palpitations   . Paresthesia   . PVC (premature ventricular contraction) 04/28/2016  . Right ear pain    Past Surgical History:  Procedure Laterality Date  . echocardiogram stress test  12/30/10  . TRANSTHORACIC ECHOCARDIOGRAM  03/11/06    Allergies  Allergies  Allergen Reactions  . Doxycycline Rash    History of Present Illness    Ms. Lauren [name] has a PMH of hypertension, postpartum cardiomyopathy 2002 (EF recovered to 55%), PACs, and PVCs.  Previously she reported that after delivering her baby her ejection fraction reduced to 15%.  She also had clue at the same time.  She subsequently had complete recovery of her ventricular function.  While her ejection fraction was low she was noted to have PVCs that improved with increased ejection fraction.  October 2017 she was seen for recurrent palpitations.  Her 7-day event monitor 10/17 showed PACs and PVCs.  It was noted that her palpitations were triggered by her migraines.  She was started on metoprolol at that time.  She also was noted to have atypical chest pain that was not present with exertion.  She was seen in follow-up by Dr. Duke Salvia on 06/29/2018 during that time she was feeling well.  It was noted  that she had palpitations approximately one time per week.  These would last for seconds and were described as heart fluttering.  She denied associated chest pain, shortness of breath, lightheadedness, and dizziness.  She was exercising with yoga two times per week and is also walking/running for exercise.  She had no exertional symptoms.  She denied orthopnea, PND, lower extremity edema.  She indicated that she had run out of her metoprolol for 1-2 weeks and noticed that she had more migraines when she was not on the medication.  She presents the clinic today for follow-up evaluation and states she feels well.  She continues to be very physically active exercising 4 times per week with activities such as strength training, walking and running.  She states that she eats a healthy diet.  She is concerned about her diastolic blood pressure being elevated at times.  She states that her primary care doctor switched her metoprolol succinate to metoprolol tartrate and then she discontinued medication.  She occasionally has a fluttering sensation.  She indicates that this is not bothersome for her is brief and random.  It may happen 5 times per week or 5 times per day.  I will give her the salty 6 diet sheet, have her maintain her physical activity, maintain p.o. hydration, and follow-up with Dr. Duke Salvia or myself in a year.  Today she denies chest pain, shortness of breath, lower extremity edema, fatigue, palpitations, melena, hematuria, hemoptysis, diaphoresis, weakness, presyncope, syncope, orthopnea, and PND.  Home Medications    Prior to Admission medications   Medication Sig Start Date End Date Taking? Authorizing Provider  Cholecalciferol (VITAMIN D3 PO) Take 5,000 Units by mouth daily.    [provider]  MAGNESIUM PO Take 500 mg by mouth daily.    [provider]  metoprolol succinate (TOPROL-XL) 25 MG 24 hr tablet Take 0.5 tablets (12.5 mg total) by mouth daily. 06/29/18    Skeet Latch, MD  OVER THE COUNTER MEDICATION 75-150 mg daily. Lemon balm    [provider]  SUMAtriptan (IMITREX) 100 MG tablet Take 1 tablet (100 mg total) by mouth once as needed for up to 1 dose for migraine. May repeat in 2 hours if headache persists or recurs once. 01/25/18   Kathrynn Ducking, MD    Family History    Family History  Problem Relation Age of Onset  . Cancer Maternal Grandmother        reproductive organs  . Cancer Paternal Grandmother   . Atrial fibrillation Mother   . Heart failure Mother   . CAD Mother   . Hypertension Father   . Atrial fibrillation Father   . Peripheral Artery Disease Father   . Diabetes Father    She indicated that her mother is alive. She indicated that her father is alive. She indicated that her maternal grandmother is alive. She indicated that the status of her paternal grandmother is unknown and reported the following: cancer.  Social History    Social History   Socioeconomic History  . Marital status: Married    Spouse name: Jenny Reichmann  . Number of children: 2  . Years of education: 73  . Highest education level: Not on file  Occupational History  . Occupation: Physical therapy Engineer, materials: PRN with [employer]    Employer: UNEMPLOYED  Tobacco Use  . Smoking status: Former Smoker    Quit date: 06/02/1989    Years since quitting: 30.5  . Smokeless tobacco: Never Used  . Tobacco comment: started at 41, smoked less than 1 ppd; quit in 1990  Substance and Sexual Activity  . Alcohol use: Yes    Alcohol/week: 0.0 standard drinks    Comment: approximately 3-5 drinks/week   . Drug use: No  . Sexual activity: Yes    Partners: Male  Other Topics Concern  . Not on file  Social History Narrative   Patient is Married ([name]) and lives at home with her family.   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   Patient is left-handed.   Patient drinks  one cup of coffee and two cups per day.   Social Determinants of Health   Financial Resource Strain:   . Difficulty of Paying Living Expenses:   Food Insecurity:   . Worried About Charity fundraiser in the Last Year:   . Arboriculturist in the Last Year:   Transportation Needs:   . Film/video editor (Medical):   Marland Kitchen Lack of Transportation (Non-Medical):   Physical Activity:   . Days of Exercise per Week:   . Minutes of Exercise per Session:   Stress:   . Feeling of Stress :   Social Connections:   . Frequency of Communication with Friends and Family:   . Frequency of Social Gatherings with Friends and Family:   . Attends Religious Services:   . Active Member of Clubs  or Organizations:   . Attends Banker Meetings:   Marland Kitchen Marital Status:   Intimate Partner Violence:   . Fear of Current or Ex-Partner:   . Emotionally Abused:   Marland Kitchen Physically Abused:   . Sexually Abused:      Review of Systems    General:  No chills, fever, night sweats or weight changes.  Cardiovascular:  No chest pain, dyspnea on exertion, edema, orthopnea, palpitations, paroxysmal nocturnal dyspnea. Dermatological: No rash, lesions/masses Respiratory: No cough, dyspnea Urologic: No hematuria, dysuria Abdominal:   No nausea, vomiting, diarrhea, bright red blood per rectum, melena, or hematemesis Neurologic:  No visual changes, wkns, changes in mental status. All other systems reviewed and are otherwise negative except as noted above.  Physical Exam    VS:  BP 115/74   Pulse (!) 57   Temp (!) 96.8 F (36 C)   Ht 5\' 3"  (1.6 m)   Wt 116 lb 3.2 oz (52.7 kg)   LMP 10/13/2013   SpO2 99%   BMI 20.58 kg/m  , BMI Body mass index is 20.58 kg/m. GEN: Well nourished, well developed, in no acute distress. HEENT: normal. Neck: Supple, no JVD, carotid bruits, or masses. Cardiac: RRR, no murmurs, rubs, or gallops. No clubbing, cyanosis, edema.  Radials/DP/PT 2+ and equal bilaterally.   Respiratory:  Respirations regular and unlabored, clear to auscultation bilaterally. GI: Soft, nontender, nondistended, BS + x 4. MS: no deformity or atrophy. Skin: warm and dry, no rash. Neuro:  Strength and sensation are intact. Psych: Normal affect.  Accessory Clinical Findings    ECG personally reviewed by me today-septal infarct undetermined age 77 bpm- No acute changes  EKG 06/29/2018 Sinus bradycardia, septal infarct undetermined age T wave abnormality consider lateral ischemia 54 bpm  Echocardiogram 01/08/2011 Study Conclusions   - Stress ECG conclusions: The stress ECG was normal.  - Baseline: LV global systolic function was normal. The estimated LV  ejection fraction was 55%. Normal wall motion; no LV regional wall  motion abnormalities.  - Low dose: LV global systolic function was normal. Normal wall  motion; no LV regional wall motion abnormalities.  - Peak stress: LV global systolic function was vigorous. Normal wall  motion; no LV regional wall motion abnormalities.  - Recovery: LV global systolic function was normal. Normal wall  motion; no LV regional wall motion abnormalities.  Impressions:   - With stress there is appropriate increase in contractility and  thickening in all segments. There is decrease in cavity size. This  is a normal stress echo study.    Assessment & Plan   1.  PACs/PVCs-no recent episodes of heart fluttering.  History of PACs and PVCs with lower ejection fraction and after ejection fraction recovery. Heart healthy low-sodium diet-salty 6 given Increase physical activity as tolerated Avoid triggers caffeine, chocolate, EtOH etc.  History of Acute Systolic heart failure-no increased work of breathing or activity intolerance.  Reportedly EF decreased to 15% postpartum 2000.  Follow-up echocardiogram showed a increased LV function to 55% Continue metoprolol Heart healthy low-sodium diet-salty 6 given Increase physical activity  as tolerated  Hyperlipidemia-LDL 128 on 06/29/2018 Heart healthy low-sodium high-fiber diet Increase physical activity as tolerated-goal 150 minutes of moderate physical activity per week Followed by PCP  Disposition: Follow-up with Dr. 07/01/2018 in 1 year.    Duke Salvia. Trafton Roker NP-C    11/25/2019, 9:16 AM [facility] [address] Office [phone] Fax [phone]

## 2019-11-25 ENCOUNTER — Ambulatory Visit (INDEPENDENT_AMBULATORY_CARE_PROVIDER_SITE_OTHER): Payer: 59 | Admitting: General Practice

## 2019-11-25 ENCOUNTER — Encounter: Payer: Self-pay | Admitting: General Practice

## 2019-11-25 ENCOUNTER — Other Ambulatory Visit: Payer: Self-pay

## 2019-11-25 VITALS — BP 115/74 | HR 57 | Temp 96.8°F | Ht 63.0 in | Wt 116.2 lb

## 2019-11-25 DIAGNOSIS — I10 Essential (primary) hypertension: Secondary | ICD-10-CM

## 2019-11-25 DIAGNOSIS — I491 Atrial premature depolarization: Secondary | ICD-10-CM | POA: Diagnosis not present

## 2019-11-25 DIAGNOSIS — I5021 Acute systolic (congestive) heart failure: Secondary | ICD-10-CM | POA: Diagnosis not present

## 2019-11-25 DIAGNOSIS — Z5181 Encounter for therapeutic drug level monitoring: Secondary | ICD-10-CM

## 2019-11-25 DIAGNOSIS — I493 Ventricular premature depolarization: Secondary | ICD-10-CM

## 2019-11-25 NOTE — Patient Instructions (Addendum)
Medication Instructions:  The current medical regimen is effective;  continue present plan and medications as directed. Please refer to the Current Medication list given to you today. *If you need a refill on your cardiac medications before your next appointment, p  Special Instructions Maintain physical activity  Increase hydration  Please try to avoid these triggers:  Do not use any products that have nicotine or tobacco in them. These include cigarettes, e-cigarettes, and chewing tobacco. If you need help quitting, ask your doctor.  Eat heart-healthy foods. Talk with your doctor about the right eating plan for you.  Exercise regularly as told by your doctor.  Do not drink alcohol, diet pills, energy drinks, Caffeine or chocolate.  Lose weight if you are overweight.   PLEASE READ AND FOLLOW SALTY 6-ATTACHED  Follow-Up: Your next appointment:  12 month(s) Please call our office 2 months in advance to schedule this appointment In Person with You may see Chilton Si, MD Edd Fabian, FNP or one of the following Advanced Practice Providers on your designated Care Team:  Corine Shelter, PA-C  Marjie Skiff, [place]  At [facility], you and your health needs are our priority.  As part of our continuing mission to provide you with exceptional heart care, we have created designated Provider Care Teams.  These Care Teams include your primary Cardiologist (physician) and Advanced Practice Providers (APPs -  Physician Assistants and Nurse Practitioners) who all work together to provide you with the care you need, when you need it.

## 2020-07-19 ENCOUNTER — Ambulatory Visit: Payer: 59 | Admitting: Neurology

## 2020-07-19 ENCOUNTER — Ambulatory Visit (INDEPENDENT_AMBULATORY_CARE_PROVIDER_SITE_OTHER): Payer: 59 | Admitting: Neurology

## 2020-07-19 ENCOUNTER — Encounter: Payer: Self-pay | Admitting: Neurology

## 2020-07-19 ENCOUNTER — Other Ambulatory Visit: Payer: Self-pay

## 2020-07-19 DIAGNOSIS — G43709 Chronic migraine without aura, not intractable, without status migrainosus: Secondary | ICD-10-CM | POA: Diagnosis not present

## 2020-07-19 MED ORDER — EMGALITY 120 MG/ML ~~LOC~~ SOAJ: 240.0000 mg | Freq: Once | SUBCUTANEOUS | 0 refills | Status: AC

## 2020-07-19 MED ORDER — EMGALITY 120 MG/ML ~~LOC~~ SOAJ: 120.0000 mg | SUBCUTANEOUS | 11 refills | Status: DC

## 2020-07-19 NOTE — Progress Notes (Signed)
I have read the note, and I agree with the clinical assessment and plan.  Lauren Lauren   

## 2020-07-19 NOTE — Patient Instructions (Addendum)
Let's try the Emgality injection, first dose is loading dose 240 mg (2 pens) Followed by 120 mg 30 days later for maintenance dosing Give 3 months to see full benefit See you back 6 months

## 2020-07-19 NOTE — Progress Notes (Signed)
PATIENT: Lauren [name] DOB: [birth year]  REASON FOR VISIT: follow up HISTORY FROM: patient  HISTORY OF PRESENT ILLNESS: Today 07/19/20 Lauren [name] is a 55 year old female with history of migraine headache.  She has not been seen since April 2019.  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  Has been on Toprol, but has helped her headaches, and muscle fasciculations.   Here today, overall well. Migraines started increasing in early 2020, are always usually left sided, can get them on the right side as she has gotten older. In 2020, started getting occipital headaches, they now move all around with migraine features. Since November, getting 1 migraine a week, takes Imitrex, headaches can last several days to some degree. Started using cervical traction pillow 10 minutes daily, helping a lot, have gotten better. Is back on metoprolol 25 mg daily for about a year, sees cardiology. In 2020 also tried amitriptyline, no change.  Years ago tried Topamax, Effexor, without benefit. Still has some weird sensory alterations. She is still active, exercises. Here today alone. 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  Is in nursing school.  Here today for follow-up unaccompanied.  HISTORY 09/04/2017 Dr. Anne Hahn: Lauren [name] is a 55 year old left-handed white female with a history of migraine headache.  The patient has had a good improvement in her headache frequency, she recently was placed on Toprol taking one half of a 25 mg tablet daily.  The patient has had only about one headache in the last month.  She takes Imitrex when the headache comes on.  Previously, she was having greater than 12 headache days a month.  She was under a lot of stress associated with separation that occurred in the summer 2018.  The patient is doing better at this point.  Being on the beta-blocker has also  improved her muscle twitches, she also has unusual sensations mainly in the right anterolateral thigh with vibration sensations.  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  The patient is physically active, she has not had any weakness with her workouts.  She denies any actual muscle cramps.  She returns to the office today for an evaluation.   REVIEW OF SYSTEMS: Out of a complete 14 system review of symptoms, the patient complains only of the following symptoms, and all other reviewed systems are negative.  Headache  ALLERGIES: Allergies  Allergen Reactions  . Doxycycline Rash    HOME MEDICATIONS: Outpatient Medications Prior to Visit  Medication Sig Dispense Refill  . Cholecalciferol (VITAMIN D3 PO) Take 5,000 Units by mouth daily.    Marland Kitchen MAGNESIUM PO Take 500 mg by mouth daily.    . metoprolol tartrate (LOPRESSOR) 25 MG tablet Take 25 mg by mouth daily.    Marland Kitchen OVER THE COUNTER MEDICATION 75-150 mg daily. Lemon balm    . SUMAtriptan (IMITREX) 100 MG tablet Take 1 tablet (100 mg total) by mouth once as needed for up to 1 dose for migraine. May repeat in 2 hours if headache persists or recurs once. 10 tablet 5   No facility-administered medications prior to visit.    PAST MEDICAL HISTORY: Past Medical History:  Diagnosis Date  . 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  Cardiomyopathy    post-partum 2002  . Dizziness   . History of cardiomyopathy   . Hyperlipidemia 04/28/2016  . Iron deficiency anemia   . Menstrual migraine   . PAC (premature atrial contraction) 04/28/2016  . Pain of cervical spine   . Palpitations   . Paresthesia   . PVC (premature ventricular contraction) 04/28/2016  . Right ear pain     PAST SURGICAL HISTORY: Past Surgical History:  Procedure Laterality Date  . echocardiogram stress test   12/30/10  . TRANSTHORACIC ECHOCARDIOGRAM  03/11/06    FAMILY HISTORY: Family History  Problem Relation Age of Onset  . Cancer Maternal Grandmother        reproductive organs  . Cancer Paternal Grandmother   . Atrial fibrillation Mother   . Heart failure Mother   . CAD Mother   . Hypertension Father   . Atrial fibrillation Father   . Peripheral Artery Disease Father   . Diabetes Father     SOCIAL HISTORY: Social History   Socioeconomic History  . Marital status: Married    Spouse name: Jonny Ruiz  . Number of children: 2  . Years of education: 29  . Highest education level: Not on file  Occupational History  . Occupation: Physical therapy Electronics engineer: PRN with [employer]    Employer: UNEMPLOYED  Tobacco Use  . Smoking status: Former Smoker    Quit date: 06/02/1989    Years since quitting: 31.1  . Smokeless tobacco: Never Used  . Tobacco comment: started at 16, smoked less than 1 ppd; quit in 1990  Substance and Sexual Activity  . Alcohol use: Yes    Alcohol/week: 0.0 standard drinks    Comment: approximately 3-5 drinks/week   . Drug use: No  . Sexual activity: Yes    Partners: Male  Other Topics Concern  . Not on file  Social History Narrative   Patient is Married ([name]) and lives at home with her family.   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   Patient is left-handed.   Patient drinks one cup of coffee and two cups per day.   Social Determinants of Health   Financial Resource Strain: Not on file  Food Insecurity: Not on file  Transportation Needs: Not on file  Physical Activity: Not on file  Stress: Not on file  Social Connections: Not on file  Intimate Partner Violence: Not on file   PHYSICAL EXAM  Vitals:   07/19/20 0944  BP: 121/80  Pulse: 67  Weight: 116 lb (52.6 kg)  Height: 5\' 4"  (1.626 m)   Body mass index is 19.91 kg/m.  Generalized: Well developed, in no acute  distress   Neurological examination  Mentation: Alert oriented to time, place, history taking. Follows all commands speech and language fluent Cranial nerve II-XII: Pupils were equal round reactive to light. Extraocular movements were full, visual field were full on confrontational test. Facial sensation and strength were normal. Head turning and shoulder shrug  were normal and symmetric. Motor: The motor testing reveals 5 over 5 strength of all 4 extremities. Good symmetric motor tone is noted throughout.  Sensory: Sensory testing is intact to soft touch on all 4 extremities. No evidence of extinction is noted.  Coordination: Cerebellar testing reveals good finger-nose-finger and heel-to-shin bilaterally.  Gait and station: Gait is normal. Tandem gait is normal. Romberg is negative. No drift is seen.  Reflexes: Deep tendon reflexes are symmetric and normal bilaterally.   DIAGNOSTIC DATA (LABS, IMAGING, TESTING) - I reviewed patient records, labs, notes, testing and imaging myself where available.  Lab Results  Component Value Date   WBC 4.3 03/13/2016   HGB 14.4 03/13/2016   HCT 42.5 03/13/2016   MCV 91.2 03/13/2016   PLT 243 03/13/2016      Component Value Date/Time   NA 140 06/29/2018 0952   K 4.8 06/29/2018 0952   CL 101 06/29/2018 0952   CO2 25 06/29/2018 0952   GLUCOSE 86 06/29/2018 0952   GLUCOSE 100 (H) 05/29/2017 1136   BUN 16 06/29/2018 0952   CREATININE 0.70 06/29/2018 0952   CREATININE 0.64 05/29/2017 1136   CALCIUM 10.0 06/29/2018 0952   PROT 6.7 06/29/2018 0952   ALBUMIN 4.5 06/29/2018 0952   AST 22 06/29/2018 0952   ALT 17 06/29/2018 0952   ALKPHOS 60 06/29/2018 0952   BILITOT 0.4 06/29/2018 0952   GFRNONAA 100 06/29/2018 0952   GFRNONAA 103 05/29/2017 1136   GFRAA 115 06/29/2018 0952   GFRAA 120 05/29/2017 1136   Lab Results  Component Value Date   CHOL 226 (H) 06/29/2018   HDL 81 06/29/2018   LDLCALC 128 (H) 06/29/2018   TRIG 83 06/29/2018   CHOLHDL  2.8 06/29/2018   No results found for: HGBA1C No results found for: VITAMINB12 Lab Results  Component Value Date   TSH 0.87 03/13/2016      ASSESSMENT AND PLAN 55 y.o. 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 here with:  1.  Chronic migraine headache 2.  Benign sensory alterations  -Increase in headaches over the last 2 years -Will start Emgality for migraine prevention -Currently on metoprolol -Previously tried and failed Topamax, amitriptyline, Effexor -Continue Imitrex as needed for acute headache from PCP -Follow-up in 6 months or sooner if needed   I spent 20 minutes of face-to-face and non-face-to-face time with patient.  This included previsit chart review, lab review, study review, order entry, electronic health record documentation, patient education.  Margie Ege, AGNP-C, DNP 07/19/2020, 10:13 AM [facility] [address], [address] [place], [place] [address] [phone]

## 2020-07-26 ENCOUNTER — Telehealth: Payer: Self-pay | Admitting: Emergency Medicine

## 2020-07-26 NOTE — Telephone Encounter (Signed)
PA for Emgality started over the phone.  Unable to get PA to go through [facility]. Called [phone] PA# [id number]  Awaiting determination from [facility].

## 2020-07-30 NOTE — Telephone Encounter (Signed)
Faxed additional information to [employer] for Emgality Pen PA.    OK transmission received

## 2020-08-02 NOTE — Telephone Encounter (Signed)
Approved via fax notification  PA Reference number [id number]  Approved for Emgality 07/30/20 - 08/21/20 for 2 ml for 30 days  Approved for Emgality 08/22/20 - 01/26/21 for 1 ml for 30 days.

## 2020-08-09 ENCOUNTER — Encounter: Payer: Self-pay | Admitting: Neurology

## 2021-01-16 ENCOUNTER — Ambulatory Visit (HOSPITAL_BASED_OUTPATIENT_CLINIC_OR_DEPARTMENT_OTHER): Payer: 59 | Admitting: Cardiovascular Disease

## 2021-01-28 ENCOUNTER — Telehealth: Payer: Self-pay | Admitting: Neurology

## 2021-01-28 NOTE — Telephone Encounter (Signed)
..   Pt understands that although there may be some limitations with this type of visit, we will take all precautions to reduce any security or privacy concerns.  Pt understands that this will be treated like an in office visit and we will file with pt's insurance, and there may be a patient responsible charge related to this service. ? ?

## 2021-01-30 ENCOUNTER — Telehealth (INDEPENDENT_AMBULATORY_CARE_PROVIDER_SITE_OTHER): Payer: 59 | Admitting: Neurology

## 2021-01-30 ENCOUNTER — Encounter: Payer: Self-pay | Admitting: Neurology

## 2021-01-30 DIAGNOSIS — G43709 Chronic migraine without aura, not intractable, without status migrainosus: Secondary | ICD-10-CM

## 2021-01-30 MED ORDER — BIJUVA 1-100 MG PO CAPS: 1.0000 | ORAL_CAPSULE | Freq: Every day | ORAL | Status: DC

## 2021-01-30 MED ORDER — TIZANIDINE HCL 2 MG PO TABS: 2.0000 mg | ORAL_TABLET | Freq: Three times a day (TID) | ORAL | 1 refills | Status: DC

## 2021-01-30 MED ORDER — PREDNISONE 5 MG PO TABS: ORAL_TABLET | ORAL | 0 refills | Status: DC

## 2021-01-30 NOTE — Progress Notes (Signed)
     Virtual Visit via Video Note  I connected with Lauren [name] on 01/30/21 at  2:00 PM EDT by a video enabled telemedicine application and verified that I am speaking with the correct person using two identifiers.  Location: Patient: The patient is in her car Provider: Physician at work   I discussed the limitations of evaluation and management by telemedicine and the availability of in person appointments. The patient expressed understanding and agreed to proceed.  History of Present Illness: Lauren [name] is a 55 year old patient with a history of intractable migraine headache.  She initially got a good response with beta-blockers, but within the last year her headaches have worsened.  She was switched to [facility] with initial good improvement but over the last months she has had daily headaches.  The headaches are now coming mainly from the left neck and shoulder into the back of the head, she does have some neck stiffness with this.  She denies any nausea or vomiting or any photophobia or phonophobia but certain sweet odors may bother her during the headache.  She may have some tingling intermittently on the left hand.  In 2014 cervical spine MRI did not show evidence of nerve root impingement.  The patient will take Imitrex with some benefit with the headache but she cannot take this daily.  Medication such as Advil or Excedrin Migraine have not been effective.  By doing some stretching exercises, she is able to keep the headache to a minimum.   Observations/Objective: The clinical examination was limited by poor video contact, low bandwidth.  The patient has symmetric face, normal speech pattern.  Assessment and Plan: 1.  Intractable migraine headache  The patient is having daily headaches mainly in the left neck and occipital area.  There may be a muscle tension component along with migraine.  She we will continue Emgality, I will give her a 6-day prednisone Dosepak and a  10-day course of tizanidine.  We may consider physical therapy in the future if this is not effective.  The patient will otherwise follow-up in 4 months, she may see Dr. Lucia Gaskins in the future.  Follow Up Instructions: 86-month follow-up   I discussed the assessment and treatment plan with the patient. The patient was provided an opportunity to ask questions and all were answered. The patient agreed with the plan and demonstrated an understanding of the instructions.   The patient was advised to call back or seek an in-person evaluation if the symptoms worsen or if the condition fails to improve as anticipated.  I provided 21 minutes of non-face-to-face time during this encounter.   York Spaniel, MD

## 2021-02-07 ENCOUNTER — Other Ambulatory Visit: Payer: Self-pay | Admitting: Neurology

## 2021-02-07 DIAGNOSIS — G4486 Cervicogenic headache: Secondary | ICD-10-CM

## 2021-02-08 ENCOUNTER — Ambulatory Visit (INDEPENDENT_AMBULATORY_CARE_PROVIDER_SITE_OTHER): Payer: 59 | Admitting: Cardiovascular Disease

## 2021-02-08 ENCOUNTER — Other Ambulatory Visit: Payer: Self-pay

## 2021-02-08 VITALS — BP 114/86 | HR 68 | Ht 64.0 in | Wt 119.1 lb

## 2021-02-08 DIAGNOSIS — I10 Essential (primary) hypertension: Secondary | ICD-10-CM

## 2021-02-08 DIAGNOSIS — R002 Palpitations: Secondary | ICD-10-CM

## 2021-02-08 MED ORDER — METOPROLOL SUCCINATE ER 25 MG PO TB24: 25.0000 mg | ORAL_TABLET | Freq: Every day | ORAL | 3 refills | Status: DC

## 2021-02-08 NOTE — Progress Notes (Signed)
Cardiology Office Note   Date:  02/08/2021   ID:  Lauren [name], DOB 05/06/1966, MRN [id number]  PCP:  Lauren Bussing, MD  Cardiologist:   Lauren [name]   No chief complaint on file.   History of Present Illness: Lauren [name] is a 55 y.o. 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  She reports that after delivering her baby her ejection fraction reduced to 15%.  She also had Lauren flu at Lauren same time.  She subsequently had complete recovery of ventricular function.  She had PVCs when her ejection fraction was low, but this subsequently improved.   She was seen 03/2016 for recurrent palpitations.  She had a 7 day event monitor 03/2016 that revealed PACs and PVCs. She notes that her palpitations may be triggered by migraines.  At her last appointment she was started on metoprolol.  She also reported atypical chest pain that never occurred with exertion. She called Lauren office 03/2019 with Atypical chest pain . She lasted followed up with Lauren Fabian, NP on  6/21 and was doing well.   Today, [name] is doing okay. She notices occasional palpitations that comes a couple times a month with duration of 5 seconds however recently hasn't had any. Started to notice episodes in Lauren beginning of August when she was sitting down at dinner and drinking alcohol. She exercises 4-5 times a week with walking or running but hasn't been motivated as much due to stress with work and divorce. She denied any chest pains or shortness of breath. She drinks 1-2 cups of coffee.   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     Estradiol-Progesterone (BIJUVA) 1-100 MG CAPS Take 1 capsule by mouth daily. 30 capsule    Galcanezumab-gnlm (EMGALITY) 120 MG/ML SOAJ Inject 120 mg into Lauren skin every 30 (thirty) days. 1.12 mL 11   MAGNESIUM PO Take 500 mg by mouth daily.     metoprolol tartrate (LOPRESSOR) 25 MG tablet Take 25 mg by mouth daily.     OVER Lauren COUNTER MEDICATION 75-150 mg daily. Lemon balm     predniSONE (DELTASONE) 5 MG tablet Begin taking 6 tablets daily, taper by one tablet daily until off Lauren medication. 21 tablet 0   SUMAtriptan (IMITREX) 100 MG tablet Take 1 tablet (100 mg total) by mouth once as needed for up to 1 dose for migraine. May repeat in 2 hours if headache persists or recurs once. 10 tablet 5   tiZANidine (ZANAFLEX) 2 MG tablet Take 1 tablet (2 mg total) by mouth 3 (three) times daily. 30 tablet 1   No current facility-administered medications for this visit.  Allergies:   Doxycycline    Social History:  Lauren [name]  reports that she quit smoking about 31 years ago. She has never used smokeless tobacco. She reports current alcohol use. She reports that she does not use drugs.   Family History:  Lauren [name]'s family history includes Atrial fibrillation in her father and mother; CAD in her mother; Cancer in her maternal grandmother and paternal grandmother; Diabetes in her father; Heart failure in her mother; Hypertension in her father; Peripheral Artery Disease in her father.    ROS:  Please see Lauren history of present illness.   (+) palpitations   All other systems are  reviewed and negative.    PHYSICAL EXAM: VS:  LMP 10/13/2013  , BMI There is no height or weight on file to calculate BMI. GENERAL:  Well appearing HEENT: Pupils equal round and reactive, fundi not visualized, oral mucosa unremarkable NECK:  No jugular venous distention, waveform within normal limits, carotid upstroke brisk and symmetric, no bruits LUNGS:  Clear to auscultation bilaterally HEART:  Bradycardia.  Regular rhythm.  PMI not displaced or sustained,S1 and S2 within normal limits, no S3, no S4, no clicks, no rubs, no murmurs ABD:  Flat, positive bowel sounds normal in frequency in pitch, no bruits, no rebound, no guarding, no midline pulsatile mass, no hepatomegaly, no splenomegaly EXT:  2 plus pulses throughout, no edema, no cyanosis no clubbing SKIN:  No rashes no nodules NEURO:  Cranial nerves II through XII grossly intact, motor grossly intact throughout PSYCH:  Cognitively intact, oriented to person place and time   EKG:  03/05/16: Sinus rhythm. Rate 65 bpm. Occasional PACs. Cannot rule out prior septal infarct. 05/06/17: Sinus rhythm.  Rate 66 bpm.  Prior septal infarct. 06/29/2018: Sinus bradycardia.  Rate 54 bpm.  Inferolateral T wave inversions. 9/22: sinus rhythm, 68 bpm. Cannot rule out prior Septal Infract   7 Day Event Monitor 03/13/16:   Quality: Fair.  Baseline artifact. Predominant rhythm: sinus rhythm Average heart rate: 71 bpm   Pauses >2.5 seconds: 0 PVCs and PACs noted   Recent Labs: No results found for requested labs within last 8760 hours.    Lipid Panel    Component Value Date/Time   CHOL 226 (H) 06/29/2018 0952   TRIG 83 06/29/2018 0952   HDL 81 06/29/2018 0952   CHOLHDL 2.8 06/29/2018 0952   CHOLHDL 2.6 03/13/2016 0943   VLDL 14 03/13/2016 0943   LDLCALC 128 (H) 06/29/2018 0952      Wt Readings from Last 3 Encounters:  07/19/20 116 lb (52.6 kg)  11/25/19 116 lb 3.2 oz (52.7 kg)  06/29/18 118 lb 9.6 oz (53.8 kg)      ASSESSMENT  AND PLAN:  CARDIOMYOPATHY Since Lauren history of peripartum cardiomyopathy.  Her LVEF improved to 55%.  She now has some exertional intolerance and generally feels unwell at times.  We will repeat her echo to assess for her systolic function.  PALPITATIONS She reports palpitations that occur about once a month, sometimes less frequently.  She has known PACs or PVCs but this seems to be different.  We will check a TSH, CMP 8, and CBC.  We discussed getting an ambulatory monitor, but given that they are not occurring very frequently we will hold off.  It does not last long enough to use a cardia mobile device.  She has been taking Lauren Toprol tartrate once a day.  We will switch this to succinate for improved coverage.  Hyperlipidemia She will  come back for fasting lipids and a CMP.  ASCVD 10-year risk is low so she has not been on a statin.   Current medicines are reviewed at length with Lauren [name] today.  Lauren [name] does not have concerns regarding medicines.  Lauren following changes have been made:  no change  Labs/ tests ordered today include:   No orders of Lauren defined types were placed in this encounter.    Disposition:   FU with Willadene Mounsey C. Duke Salvia, MD, [facility] in 3 months    I,Jada Bradford,acting as a Neurosurgeon for Chilton Si, MD.,have documented all relevant documentation on Lauren behalf of Chilton Si, MD,as directed by  Chilton Si, MD while in Lauren presence of Chilton Si, MD.  I, Lycia Sachdeva C. Duke Salvia, MD have reviewed all documentation for this visit.  Lauren documentation of Lauren exam, diagnosis, procedures, and orders on 02/24/2021 are all accurate and complete.   Signed, Alonzo Owczarzak C. Duke Salvia, MD, [facility]  02/08/2021 10:44 AM    [facility]

## 2021-02-08 NOTE — Assessment & Plan Note (Signed)
She will come back for fasting lipids and a CMP.  ASCVD 10-year risk is low so she has not been on a statin.

## 2021-02-08 NOTE — Assessment & Plan Note (Signed)
She reports palpitations that occur about once a month, sometimes less frequently.  She has known PACs or PVCs but this seems to be different.  We will check a TSH, CMP 8, and CBC.  We discussed getting an ambulatory monitor, but given that they are not occurring very frequently we will hold off.  It does not last long enough to use a cardia mobile device.  She has been taking the Toprol tartrate once a day.  We will switch this to succinate for improved coverage.

## 2021-02-08 NOTE — Patient Instructions (Signed)
Medication Instructions:  STOP METOPROLOL TART  START METOPROLOL SUC 25 MG DAILY   *If you need a refill on your cardiac medications before your next appointment, please call your pharmacy*  Lab Work: FASTING LP/CMET/CBC/MAGNESIUM/TSH/FT4 SOON   If you have labs (blood work) drawn today and your tests are completely normal, you will receive your results only by: MyChart Message (if you have MyChart) OR A paper copy in the mail If you have any lab test that is abnormal or we need to change your treatment, we will call you to review the results.  Testing/Procedures: Your physician has requested that you have an echocardiogram. Echocardiography is a painless test that uses sound waves to create images of your heart. It provides your doctor with information about the size and shape of your heart and how well your heart's chambers and valves are working. This procedure takes approximately one hour. There are no restrictions for this procedure.  Follow-Up: At [facility], you and your health needs are our priority.  As part of our continuing mission to provide you with exceptional heart care, we have created designated Provider Care Teams.  These Care Teams include your primary Cardiologist (physician) and Advanced Practice Providers (APPs -  Physician Assistants and Nurse Practitioners) who all work together to provide you with the care you need, when you need it.  We recommend signing up for the patient portal called "MyChart".  Sign up information is provided on this After Visit Summary.  MyChart is used to connect with patients for Virtual Visits (Telemedicine).  Patients are able to view lab/test results, encounter notes, upcoming appointments, etc.  Non-urgent messages can be sent to your provider as well.   To learn more about what you can do with MyChart, go to [email or web address].    Your next appointment:   3 month(s)  The format for your next appointment:   In  Person  Provider:   Chilton Si, MD or Gillian Shields, NP

## 2021-02-08 NOTE — Assessment & Plan Note (Signed)
Since the history of peripartum cardiomyopathy.  Her LVEF improved to 55%.  She now has some exertional intolerance and generally feels unwell at times.  We will repeat her echo to assess for her systolic function.

## 2021-02-12 ENCOUNTER — Telehealth: Payer: Self-pay | Admitting: *Deleted

## 2021-02-12 ENCOUNTER — Encounter: Payer: Self-pay | Admitting: *Deleted

## 2021-02-12 NOTE — Telephone Encounter (Signed)
Emgality PA, key: [id number], [id number]. Your information has been sent to [employer].

## 2021-02-12 NOTE — Telephone Encounter (Signed)
Emgality approved: The authorization is effective for a maximum of 12 fill(s) from 02/12/2021 to 02/11/2022. Sent my chart to advise patient.

## 2021-02-15 ENCOUNTER — Other Ambulatory Visit: Payer: Self-pay

## 2021-02-15 ENCOUNTER — Ambulatory Visit (INDEPENDENT_AMBULATORY_CARE_PROVIDER_SITE_OTHER): Payer: 59

## 2021-02-15 DIAGNOSIS — I10 Essential (primary) hypertension: Secondary | ICD-10-CM | POA: Diagnosis not present

## 2021-02-15 DIAGNOSIS — R002 Palpitations: Secondary | ICD-10-CM | POA: Diagnosis not present

## 2021-02-15 LAB — ECHOCARDIOGRAM COMPLETE
Area-P 1/2: 3.27 cm2
Calc EF: 67.8 %
P 1/2 time: 555 msec
S' Lateral: 3.02 cm
Single Plane A2C EF: 72.3 %
Single Plane A4C EF: 63.4 %

## 2021-02-16 LAB — LIPID PANEL
Chol/HDL Ratio: 2.7 ratio (ref 0.0–4.4)
Cholesterol, Total: 213 mg/dL — ABNORMAL HIGH (ref 100–199)
HDL: 80 mg/dL (ref >39)
LDL Chol Calc (NIH): 117 mg/dL — ABNORMAL HIGH (ref 0–99)
Triglycerides: 91 mg/dL (ref 0–149)
VLDL Cholesterol Cal: 16 mg/dL (ref 5–40)

## 2021-02-16 LAB — MAGNESIUM: Magnesium: 2.1 mg/dL (ref 1.6–2.3)

## 2021-02-16 LAB — COMPREHENSIVE METABOLIC PANEL
ALT: 11 IU/L (ref 0–32)
AST: 17 IU/L (ref 0–40)
Albumin/Globulin Ratio: 1.8 (ref 1.2–2.2)
Albumin: 4.2 g/dL (ref 3.8–4.9)
Alkaline Phosphatase: 56 IU/L (ref 44–121)
BUN/Creatinine Ratio: 24 — ABNORMAL HIGH (ref 9–23)
BUN: 16 mg/dL (ref 6–24)
Bilirubin Total: 0.4 mg/dL (ref 0.0–1.2)
CO2: 25 mmol/L (ref 20–29)
Calcium: 9.3 mg/dL (ref 8.7–10.2)
Chloride: 103 mmol/L (ref 96–106)
Creatinine, Ser: 0.67 mg/dL (ref 0.57–1.00)
Globulin, Total: 2.3 g/dL (ref 1.5–4.5)
Glucose: 90 mg/dL (ref 65–99)
Potassium: 4.4 mmol/L (ref 3.5–5.2)
Sodium: 139 mmol/L (ref 134–144)
Total Protein: 6.5 g/dL (ref 6.0–8.5)
eGFR: 104 mL/min/{1.73_m2} (ref >59)

## 2021-02-16 LAB — CBC WITH DIFFERENTIAL/PLATELET
Basophils Absolute: 0 10*3/uL (ref 0.0–0.2)
Basos: 1 %
EOS (ABSOLUTE): 0.1 10*3/uL (ref 0.0–0.4)
Eos: 1 %
Hematocrit: 41.4 % (ref 34.0–46.6)
Hemoglobin: 13.6 g/dL (ref 11.1–15.9)
Immature Grans (Abs): 0 10*3/uL (ref 0.0–0.1)
Immature Granulocytes: 0 %
Lymphocytes Absolute: 1.4 10*3/uL (ref 0.7–3.1)
Lymphs: 33 %
MCH: 30.8 pg (ref 26.6–33.0)
MCHC: 32.9 g/dL (ref 31.5–35.7)
MCV: 94 fL (ref 79–97)
Monocytes Absolute: 0.3 10*3/uL (ref 0.1–0.9)
Monocytes: 7 %
Neutrophils Absolute: 2.5 10*3/uL (ref 1.4–7.0)
Neutrophils: 58 %
Platelets: 207 10*3/uL (ref 150–450)
RBC: 4.41 x10E6/uL (ref 3.77–5.28)
RDW: 12.9 % (ref 11.7–15.4)
WBC: 4.3 10*3/uL (ref 3.4–10.8)

## 2021-02-16 LAB — TSH: TSH: 0.567 u[IU]/mL (ref 0.450–4.500)

## 2021-02-16 LAB — T4, FREE: Free T4: 0.99 ng/dL (ref 0.82–1.77)

## 2021-02-24 ENCOUNTER — Encounter (HOSPITAL_BASED_OUTPATIENT_CLINIC_OR_DEPARTMENT_OTHER): Payer: Self-pay | Admitting: Cardiovascular Disease

## 2021-02-27 ENCOUNTER — Other Ambulatory Visit: Payer: Self-pay | Admitting: Endocrinology

## 2021-02-27 DIAGNOSIS — E049 Nontoxic goiter, unspecified: Secondary | ICD-10-CM

## 2021-03-01 ENCOUNTER — Ambulatory Visit: Payer: 59 | Attending: Neurology

## 2021-03-01 ENCOUNTER — Other Ambulatory Visit: Payer: Self-pay

## 2021-03-01 DIAGNOSIS — M6281 Muscle weakness (generalized): Secondary | ICD-10-CM | POA: Diagnosis present

## 2021-03-01 DIAGNOSIS — M542 Cervicalgia: Secondary | ICD-10-CM | POA: Insufficient documentation

## 2021-03-01 NOTE — Therapy (Signed)
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    Baseline 3x/week    Time 4    Period Weeks    Status New    Target Date 03/29/21                       Plan - 03/01/21 1214     Clinical Impression Statement Pt is a pleasant 55yo F who presents with primary c/o chronic migraines lasting most her life, along with more recent intermittent neck pain and occasional N/T in her L 4th and 5th digits. Upon assessment, her primary impairments include painful and hypomobile cervical and upper thoracic passive accessories, tight R levator scapulae and upper traps, and weak BIL global parascapular musculature. She also demonstrates positive ulnar nerve tension testing on the L. Her impairments are most associated with probable L sided cervical radiculopathy. However, of note, she demonstrated negative compression and distraction testing, which decreases the likelihood of this diagnosis. Head pain was not reproduced with any cervical testing today and therefore, unable to rule up cervicogenic headache at this time. The pt will benefit from skilled PT to address her primary impairments and return to her prior level of function without limitation.    Personal Factors and Comorbidities Time since onset of injury/illness/exacerbation    Examination-Activity Limitations Sleep    Examination-Participation Restrictions Community Activity;Occupation    Stability/Clinical Decision Making Evolving/Moderate complexity    Clinical Decision Making Moderate    Rehab Potential Good    PT Frequency 1x / week    PT Duration 4 weeks    PT Treatment/Interventions ADLs/Self Care Home Management;Moist Heat;Taping;Dry needling;Manual techniques;Neuromuscular re-education;Therapeutic exercise;Therapeutic activities    PT Next Visit Plan *Create and administer FOTO*, Introduce UT/ levator scap stretches, ulnar nerve glides, progress DNF/ scapular strengthening    PT Home  Exercise Plan [id number]    Consulted and Agree with Plan of Care Patient             Patient will benefit from skilled therapeutic intervention in order to improve the following deficits and impairments:  Decreased strength, Impaired flexibility, Hypomobility, Pain  Visit Diagnosis: Cervicalgia  Muscle weakness (generalized)     Problem List Patient Active Problem List   Diagnosis Date Noted   Chronic migraine w/o aura w/o status migrainosus, not intractable 07/19/2020   Hyperlipidemia 04/28/2016   PAC (premature atrial contraction) 04/28/2016   PVC (premature ventricular contraction) 04/28/2016   Headache, menstrual migraine 01/09/2014   CHEST PAIN 06/06/2010   ESSENTIAL HYPERTENSION, BENIGN 04/03/2010   CARDIOMYOPATHY 11/15/2008   PARESTHESIA 11/15/2008   PALPITATIONS 11/15/2008    Carmelina Dane, PT, DPT 03/01/21 1:58 PM   [facility] [address] [place], [place], [address] Phone: [phone]   Fax:  [phone]  Name: Lauren [name] MRN: [id number] Date of Birth: August 26, 1965  Baseline 30 seconds    Time 4    Period Weeks    Status New    Target Date 03/29/21      PT SHORT TERM GOAL #4   Title Pt will report decrease in  frequency of headaches to 0-2x/week in 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    Baseline 3x/week    Time 4    Period Weeks    Status New    Target Date 03/29/21                       Plan - 03/01/21 1214     Clinical Impression Statement Pt is a pleasant 55yo F who presents with primary c/o chronic migraines lasting most her life, along with more recent intermittent neck pain and occasional N/T in her L 4th and 5th digits. Upon assessment, her primary impairments include painful and hypomobile cervical and upper thoracic passive accessories, tight R levator scapulae and upper traps, and weak BIL global parascapular musculature. She also demonstrates positive ulnar nerve tension testing on the L. Her impairments are most associated with probable L sided cervical radiculopathy. However, of note, she demonstrated negative compression and distraction testing, which decreases the likelihood of this diagnosis. Head pain was not reproduced with any cervical testing today and therefore, unable to rule up cervicogenic headache at this time. The pt will benefit from skilled PT to address her primary impairments and return to her prior level of function without limitation.    Personal Factors and Comorbidities Time since onset of injury/illness/exacerbation    Examination-Activity Limitations Sleep    Examination-Participation Restrictions Community Activity;Occupation    Stability/Clinical Decision Making Evolving/Moderate complexity    Clinical Decision Making Moderate    Rehab Potential Good    PT Frequency 1x / week    PT Duration 4 weeks    PT Treatment/Interventions ADLs/Self Care Home Management;Moist Heat;Taping;Dry needling;Manual techniques;Neuromuscular re-education;Therapeutic exercise;Therapeutic activities    PT Next Visit Plan *Create and administer FOTO*, Introduce UT/ levator scap stretches, ulnar nerve glides, progress DNF/ scapular strengthening    PT Home  Exercise Plan [id number]    Consulted and Agree with Plan of Care Patient             Patient will benefit from skilled therapeutic intervention in order to improve the following deficits and impairments:  Decreased strength, Impaired flexibility, Hypomobility, Pain  Visit Diagnosis: Cervicalgia  Muscle weakness (generalized)     Problem List Patient Active Problem List   Diagnosis Date Noted   Chronic migraine w/o aura w/o status migrainosus, not intractable 07/19/2020   Hyperlipidemia 04/28/2016   PAC (premature atrial contraction) 04/28/2016   PVC (premature ventricular contraction) 04/28/2016   Headache, menstrual migraine 01/09/2014   CHEST PAIN 06/06/2010   ESSENTIAL HYPERTENSION, BENIGN 04/03/2010   CARDIOMYOPATHY 11/15/2008   PARESTHESIA 11/15/2008   PALPITATIONS 11/15/2008    Carmelina Dane, PT, DPT 03/01/21 1:58 PM   [facility] [address] [place], [place], [address] Phone: [phone]   Fax:  [phone]  Name: Lauren [name] MRN: [id number] Date of Birth: August 26, 1965  [facility] [address] [place], [place], [address] Phone: [phone]   Fax:  [phone]  Physical Therapy Evaluation  Patient Details  Name: Lauren [name] MRN: [id number] Date of Birth: Oct 23, 1965 Referring Provider (PT): York Spaniel, MD   Encounter Date: 03/01/2021   PT End of Session - 03/01/21 1208     Visit Number 1    Number of Visits 5    Date for PT Re-Evaluation 03/29/21    Authorization Type [employer]    Authorization Time Period FOTO v6, v10    PT Start Time 1000    PT Stop Time 1045    PT Time Calculation (min) 45 min    Activity Tolerance Patient tolerated treatment well    Behavior During Therapy [facility] for tasks assessed/performed             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    Subjective Assessment - 03/01/21 1009     Subjective Pt reports primary c/o migraines related to neck pain of insidious onset lasting several years. However, she has experienced a recent flare-up lasting about 6 weeks. She also reports pain and tightness in her L upper traps/ shoulder blade. She reports that neck extension exacerbates her sxs. She has been seeing a neurologist for about 15 years in regard to her headaches and radicular sxs. She adds that she experiences headaches about 3x/ week. Pt denies hx of drop attacks, dysarthria,  dysphagia, or nausea/ vomiting. She does, however, report N/T in her L 4th and 5th digits which is present at the same time as her headaches. She denies bowel/ bladder function, unrelenting night pain, or weakness.    Patient Stated Goals Pt would like to strengthen neck and decrease frequency of headaches    Currently in Pain? No/denies    Pain Score 0-No pain    Aggravating Factors  cervical extension, sleeping    Pain Relieving Factors Headache medication                [facility] PT Assessment - 03/01/21 0001       Assessment   Medical Diagnosis Cervicogenic headache (G44.86)    Referring Provider (PT) York Spaniel, MD    Onset Date/Surgical Date 03/01/01    Hand Dominance Left    Next MD Visit December with PCM    Prior Therapy Yes, after shoulder surgery      Precautions   Precautions None      Restrictions   Weight Bearing Restrictions No      Balance Screen   Has the patient fallen in the past 6 months Yes    How many times? 1x while running    Has the patient had a decrease in activity level because of a fear of falling?  No    Is the

## 2021-03-01 NOTE — Patient Instructions (Signed)
  [id number]

## 2021-03-11 ENCOUNTER — Ambulatory Visit
Admission: RE | Admit: 2021-03-11 | Discharge: 2021-03-11 | Disposition: A | Discharge status: Home / Self Care | Payer: 59 | Source: Ambulatory Visit | Attending: Endocrinology | Admitting: Endocrinology

## 2021-03-11 DIAGNOSIS — E049 Nontoxic goiter, unspecified: Secondary | ICD-10-CM

## 2021-03-13 ENCOUNTER — Ambulatory Visit: Payer: 59 | Attending: Neurology

## 2021-03-13 ENCOUNTER — Other Ambulatory Visit: Payer: Self-pay

## 2021-03-13 DIAGNOSIS — M542 Cervicalgia: Secondary | ICD-10-CM | POA: Insufficient documentation

## 2021-03-13 DIAGNOSIS — M6281 Muscle weakness (generalized): Secondary | ICD-10-CM | POA: Diagnosis present

## 2021-03-13 NOTE — Patient Instructions (Signed)
  [id number] 

## 2021-03-13 NOTE — Therapy (Signed)
Phone: [phone]   Fax:  [phone]  Name: Lauren [name] MRN: [id number] Date of Birth: 08/28/1965  Phone: [phone]   Fax:  [phone]  Name: Lauren [name] MRN: [id number] Date of Birth: 08/28/1965  [facility] [address] [place], [place], [address] Phone: [phone]   Fax:  [phone]  Physical Therapy Treatment  Patient Details  Name: Lauren [name] MRN: [id number] Date of Birth: 08-12-1965 Referring Provider (PT): York Spaniel, MD   Encounter Date: 03/13/2021   PT End of Session - 03/13/21 1734     Visit Number 2    Number of Visits 5    Date for PT Re-Evaluation 03/29/21    Authorization Type [employer]    Authorization Time Period FOTO v6, v10    PT Start Time 1700    PT Stop Time 1750   Moist heat x 10 minutes   PT Time Calculation (min) 50 min    Activity Tolerance Patient tolerated treatment well    Behavior During Therapy [facility] for tasks assessed/performed             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   Subjective Assessment - 03/13/21 1703     Subjective Pt reports doing her exercises daily, adding that she has been feeling better, although she is just getting over a headache which lasted for about 9 days.    Currently in Pain? No/denies    Pain Score 0-No pain                               [facility] Adult PT Treatment/Exercise - 03/13/21 0001       Neck Exercises: Machines for Strengthening   Cybex Row high and low rows 2x10 each with 35#    Lat Pull 2x10 with 35#      Neck  Exercises: Seated   Other Seated Exercise Ulnar nerve tensioner on L 2x15      Neck Exercises: Supine   Cervical Isometrics Limitations chin tuck lift off 3x to exhaustion      Neck Exercises: Stretches   Upper Trapezius Stretch Right;Left;2 reps;30 seconds    Levator Stretch Right;Left;2 reps;30 seconds      Modalities   Modalities Moist Heat      Moist Heat Therapy   Number Minutes Moist Heat 10 Minutes    Moist Heat Location Cervical      Manual Therapy   Joint Mobilization Prone and supine C2-C4 upglides BIL    Soft tissue mobilization MTPR to BIL cervical paraspinals/ suboccipital muscles                     PT Education - 03/13/21 1733     Education Details Updated HEP    Person(s) Educated Patient    Methods Explanation;Demonstration;Handout    Comprehension Verbalized understanding;Returned demonstration              PT Short Term Goals - 03/01/21 1353

## 2021-03-22 ENCOUNTER — Other Ambulatory Visit: Payer: Self-pay

## 2021-03-22 ENCOUNTER — Ambulatory Visit: Payer: 59

## 2021-03-22 DIAGNOSIS — M542 Cervicalgia: Secondary | ICD-10-CM

## 2021-03-22 DIAGNOSIS — M6281 Muscle weakness (generalized): Secondary | ICD-10-CM

## 2021-03-22 NOTE — Therapy (Signed)
PT SHORT TERM GOAL #2   Title Pt will achieve FOTO score of ___ in order to demonstrate improved functional ability as it relates to her neck pain.    Baseline ___    Time 4    Period Weeks    Status New    Target Date 03/29/21      PT SHORT TERM GOAL #3   Title Pt will achieve a deep neck flexor endurance test score of 50 seconds in order to promote active cervical reinforcement in the prevention of future pain.    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    Baseline 3x/week    Time 4    Period Weeks    Status New    Target Date 03/29/21                      Plan - 03/22/21 1354     Clinical Impression Statement Pt responded well to all interventions today, demonstrating proper form and no increase in pain with selected interventions. She reports a therapeutic response to manual therapy today. She will benefit from continued  skilled PT to address her primary impairments and return to her prior level of function without limitation.    Personal Factors and Comorbidities Time since onset of injury/illness/exacerbation    Examination-Activity Limitations Sleep    Examination-Participation Restrictions Community Activity;Occupation    Stability/Clinical Decision Making Evolving/Moderate complexity    Clinical Decision Making Moderate    Rehab Potential Good    PT Frequency 1x / week    PT Duration 4 weeks    PT Treatment/Interventions ADLs/Self Care Home Management;Moist Heat;Taping;Dry needling;Manual techniques;Neuromuscular re-education;Therapeutic exercise;Therapeutic activities    PT Next Visit Plan *Create and administer FOTO*, Introduce UT/ levator scap stretches, ulnar nerve glides, progress DNF/ scapular strengthening    PT Home Exercise Plan [id number]    Consulted and Agree with Plan of Care Patient             Patient will benefit from skilled therapeutic intervention in order to improve the following deficits and impairments:  Decreased strength, Impaired flexibility, Hypomobility, Pain  Visit Diagnosis: Cervicalgia  Muscle weakness (generalized)     Problem List Patient Active Problem List   Diagnosis Date Noted   Chronic migraine w/o aura w/o status migrainosus, not intractable 07/19/2020   Hyperlipidemia 04/28/2016   PAC (premature atrial contraction) 04/28/2016   PVC (premature ventricular contraction) 04/28/2016   Headache, menstrual migraine 01/09/2014   CHEST PAIN 06/06/2010   ESSENTIAL HYPERTENSION, BENIGN 04/03/2010   CARDIOMYOPATHY 11/15/2008   PARESTHESIA 11/15/2008   PALPITATIONS 11/15/2008    Carmelina Dane, PT, DPT 03/22/21 2:02 PM   [facility] [address] [place], [place], [address] Phone: [phone]   Fax:  [phone]  Name: Lauren [name] MRN: [id number] Date of Birth: [birth year]  [facility] [address] [place], [place], [address] Phone: [phone]   Fax:  [phone]  Physical Therapy Treatment  Patient Details  Name: Lauren [name] MRN: [id number] Date of Birth: [birth year] Referring Provider (PT): York Spaniel, MD   Encounter Date: 03/22/2021   PT End of Session - 03/22/21 1353     Visit Number 3    Number of Visits 5    Date for PT Re-Evaluation 03/29/21    Authorization Type [employer]    Authorization Time Period FOTO v6, v10    PT Start Time 1325   Pt arrived 10 minutes late   PT Stop Time 1405   1403   PT Time Calculation (min) 40 min    Activity Tolerance Patient tolerated treatment well    Behavior During Therapy [facility] for tasks assessed/performed             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   Subjective Assessment - 03/22/21 1324     Subjective Pt reports experiencing tightness in her L upper trap following her last visit. She reports that due to increased pain, she was unable to perform her HEP regularly. However, she reports feeling great today.    Currently in Pain? No/denies    Pain Score 0-No pain                               [facility] Adult PT Treatment/Exercise - 03/22/21 0001       Neck Exercises: Machines for Strengthening   Cybex Row high and low rows 2x10  each with 35#    Lat Pull 2x10 with 35#      Neck Exercises: Standing   Theraband Level (UE D2) Level 2 (Red)    UE D2 Limitations 2x10 BIL      Neck Exercises: Prone   Neck Retraction Limitations Chin tuck holds with head over side of table 3x to exhaustion    Upper Extremity Flexion with Stabilization Flexion    UE Flexion with Stabilization Limitations 2x10 with 2# DB's      Manual Therapy   Soft tissue mobilization Pin and stretch to BIL upper trap 2x30 sec; MTPR to BIL cervical paraspinals and upper traps                       PT Short Term Goals - 03/01/21 1353       PT SHORT TERM GOAL #1   Title Pt will achieve BIL global scapular strength of 5/5 in order to promote proper head/ neck posture in the prophylaxis of cervical pain.    Baseline 4/5    Time 4    Period Weeks    Status New    Target Date 03/29/21

## 2021-03-29 ENCOUNTER — Ambulatory Visit: Payer: 59

## 2021-03-29 ENCOUNTER — Other Ambulatory Visit: Payer: Self-pay

## 2021-03-29 DIAGNOSIS — M542 Cervicalgia: Secondary | ICD-10-CM | POA: Diagnosis not present

## 2021-03-29 DIAGNOSIS — M6281 Muscle weakness (generalized): Secondary | ICD-10-CM

## 2021-03-29 NOTE — Patient Instructions (Signed)

## 2021-03-29 NOTE — Therapy (Addendum)
forward for best results from PT.    Personal Factors and Comorbidities Time since onset of injury/illness/exacerbation    Examination-Activity Limitations Sleep    Examination-Participation Restrictions Community Activity;Occupation    Stability/Clinical Decision Making Evolving/Moderate complexity    Clinical Decision Making Moderate    Rehab Potential Good    PT Frequency 1x / week    PT Duration 6 weeks    PT Treatment/Interventions ADLs/Self Care Home Management;Moist Heat;Taping;Dry needling;Manual techniques;Neuromuscular re-education;Therapeutic exercise;Therapeutic activities    PT Next Visit Plan progress DNF/ scapular strengthening, TPDN PRN    PT Home Exercise Plan [email or web address]    Consulted and Agree with Plan of Care Patient             Patient will benefit from skilled therapeutic intervention in order to improve the following deficits and impairments:  Decreased strength, Impaired flexibility, Hypomobility, Pain  Visit Diagnosis: Cervicalgia - Plan: PT plan of care cert/re-cert  Muscle weakness (generalized) - Plan: PT plan of care cert/re-cert     Problem List Patient Active Problem List   Diagnosis Date Noted   Chronic migraine w/o aura w/o status migrainosus, not intractable 07/19/2020   Hyperlipidemia 04/28/2016   PAC (premature atrial contraction) 04/28/2016   PVC (premature ventricular contraction) 04/28/2016   Headache, menstrual migraine 01/09/2014   CHEST PAIN 06/06/2010   ESSENTIAL HYPERTENSION, BENIGN 04/03/2010   CARDIOMYOPATHY 11/15/2008   PARESTHESIA 11/15/2008   PALPITATIONS 11/15/2008    Carmelina Dane, PT, DPT 03/29/21 2:16 PM   [facility] [address] [place], [place], [address] Phone: [phone]   Fax:  [phone]  Name: Lauren [name] MRN: [id number] Date of Birth: 1965/10/08  forward for best results from PT.    Personal Factors and Comorbidities Time since onset of injury/illness/exacerbation    Examination-Activity Limitations Sleep    Examination-Participation Restrictions Community Activity;Occupation    Stability/Clinical Decision Making Evolving/Moderate complexity    Clinical Decision Making Moderate    Rehab Potential Good    PT Frequency 1x / week    PT Duration 6 weeks    PT Treatment/Interventions ADLs/Self Care Home Management;Moist Heat;Taping;Dry needling;Manual techniques;Neuromuscular re-education;Therapeutic exercise;Therapeutic activities    PT Next Visit Plan progress DNF/ scapular strengthening, TPDN PRN    PT Home Exercise Plan [email or web address]    Consulted and Agree with Plan of Care Patient             Patient will benefit from skilled therapeutic intervention in order to improve the following deficits and impairments:  Decreased strength, Impaired flexibility, Hypomobility, Pain  Visit Diagnosis: Cervicalgia - Plan: PT plan of care cert/re-cert  Muscle weakness (generalized) - Plan: PT plan of care cert/re-cert     Problem List Patient Active Problem List   Diagnosis Date Noted   Chronic migraine w/o aura w/o status migrainosus, not intractable 07/19/2020   Hyperlipidemia 04/28/2016   PAC (premature atrial contraction) 04/28/2016   PVC (premature ventricular contraction) 04/28/2016   Headache, menstrual migraine 01/09/2014   CHEST PAIN 06/06/2010   ESSENTIAL HYPERTENSION, BENIGN 04/03/2010   CARDIOMYOPATHY 11/15/2008   PARESTHESIA 11/15/2008   PALPITATIONS 11/15/2008    Carmelina Dane, PT, DPT 03/29/21 2:16 PM   [facility] [address] [place], [place], [address] Phone: [phone]   Fax:  [phone]  Name: Lauren [name] MRN: [id number] Date of Birth: 1965/10/08  [name] [address] [place], [place], [address] Phone: [phone]   Fax:  [phone]  Physical Therapy Treatment  Patient Details  Name: Lauren [name] MRN: [id number] Date of Birth: 1965/06/26 Referring Provider (PT): York Spaniel, MD   Encounter Date: 03/29/2021   PT End of Session - 03/29/21 1402     Visit Number 4    Number of Visits 10    Date for PT Re-Evaluation 05/10/21    Authorization Type [employer]    PT Start Time 1318    PT Stop Time 1359   3 minutes of dry needle insertion   PT Time Calculation (min) 41 min    Activity Tolerance Patient tolerated treatment well    Behavior During Therapy [facility] for tasks assessed/performed             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   Subjective Assessment - 03/29/21 1317     Subjective Pt reports experiencing increased upper cervical radiating pain the day after her last treatment session, although her pain returned to baseline the next day. She also reports a migraine yesterday and N/T into her R 4th and 5th digits, which are not present today. She also reports continued migraines a couple of days per week. She reports non-adherence to her HEP over the past week.    Currently in Pain? No/denies    Pain Score 0-No pain                [facility] PT Assessment - 03/29/21  0001       Strength   Overall Strength Comments BIL LT, MT, lats, and rhomboids grossly 4/5      other    Comment Deep neck flexion endurance testing   Terminated at 24 seconds due to loss of chin tuck hold                          [facility] Adult PT Treatment/Exercise - 03/29/21 0001       Manual Therapy   Soft tissue mobilization manual soft tissue massage to BIL cervical paraspinals/ R splenius capitus following TPDN              Trigger Point Dry Needling - 03/29/21 0001     Consent Given? Yes    Education Handout Provided Yes    Muscles Treated Head and Neck Upper trapezius;Splenius capitus    Dry Needling Comments Pt experienced 3 twitch responses in BIL upper trap with palpably increased muscle extensibility. Also elicited twitch response in R splenius capitus at C5 spinal level, although muscle became palpably more tight following treatment. This was improved with manual soft tissue mobilization following needle insertion    Upper Trapezius Response Twitch reponse elicited;Palpable increased muscle length    Splenius capitus

## 2021-04-04 ENCOUNTER — Ambulatory Visit: Payer: 59 | Attending: Neurology

## 2021-04-04 ENCOUNTER — Telehealth: Payer: Self-pay

## 2021-04-04 DIAGNOSIS — M6281 Muscle weakness (generalized): Secondary | ICD-10-CM | POA: Insufficient documentation

## 2021-04-04 DIAGNOSIS — M542 Cervicalgia: Secondary | ICD-10-CM | POA: Insufficient documentation

## 2021-04-04 NOTE — Telephone Encounter (Signed)
Spoke with pt on phone. She thought her appointment was tomorrow since she has been coming on Fridays. She was apologetic about missing. She wanted to try to get an appointment, but wants to be dry needled. No one was available upon looking at the schedule for Friday and Saturday. Advised her to call back tomorrow morning, or even Monday if she wanted to wait and get an additional appointment in next week.   Bettey Mare. Corliss Marcus, PT, DPT

## 2021-04-11 ENCOUNTER — Ambulatory Visit: Payer: 59

## 2021-04-11 ENCOUNTER — Other Ambulatory Visit: Payer: Self-pay

## 2021-04-11 DIAGNOSIS — M6281 Muscle weakness (generalized): Secondary | ICD-10-CM

## 2021-04-11 DIAGNOSIS — M542 Cervicalgia: Secondary | ICD-10-CM | POA: Diagnosis present

## 2021-04-11 NOTE — Therapy (Addendum)
[facility] [address] [place], [place], [address] Phone: [phone]   Fax:  [phone]  Physical Therapy Treatment/ Discharge Summary  Patient Details  Name: Lauren [name] MRN: [id number] Date of Birth: [birth year] Referring Provider (PT): York Spaniel, MD   Encounter Date: 04/11/2021   PT End of Session - 04/11/21 1642     Visit Number 5    Number of Visits 10    Date for PT Re-Evaluation 05/10/21    Authorization Type [employer]    Authorization Time Period FOTO v6, v10    PT Start Time 1550    PT Stop Time 1633    PT Time Calculation (min) 43 min    Activity Tolerance Patient tolerated treatment well    Behavior During Therapy [facility] for tasks assessed/performed             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   Subjective Assessment - 04/11/21 1552     Subjective Pt reports today is a good day. It's either a good day or a bad day. Extension bothers her, but she doesn't realize it until later.    Currently in Pain? No/denies    Pain Score 0-No pain                [facility] PT Assessment - 04/11/21 0001       AROM   Cervical - Right Side Bend 32    Cervical - Left Side Bend 24   pain in L side   Cervical - Right Rotation 72    Cervical - Left Rotation 62                   [facility] Adult PT  Treatment/Exercise:   Therapeutic Exercise:  - FR T/S extension x 3 positions x10 reps each with pink foam roller - Suboccipital release with dense, black foam roller   Manual Therapy: - STM to B suboccipitals, palpating upper traps and levator scapulae - Grade 3 joint mobilization L cervical side glides > R - PROM cervical lateral flexion and rotation   Trigger Point Dry Needling Treatment: Pre-treatment instruction: Patient instructed on dry needling rationale, procedures, and possible side effects including pain during treatment (achy,cramping feeling), bruising, drop of blood, lightheadedness, nausea, sweating. Patient Consent Given: Yes Education handout provided: No Muscles treated: Right splenius capitus C4-5  Needle size and number: .30x12mm x 1 Electrical stimulation performed: No Parameters: N/A Treatment response/outcome: Twitch response elicited and Palpable decrease in muscle tension Post-treatment instructions: Patient instructed to expect possible mild to moderate muscle soreness later today and/or tomorrow. Patient instructed in methods to reduce muscle soreness and to continue prescribed HEP. If patient was dry needled over the lung field, patient was instructed on signs and symptoms of pneumothorax and, however unlikely, to see immediate medical attention  should they occur. Patient was also educated on signs and symptoms of infection and to seek medical attention should they occur. Patient verbalized understanding of these instructions and education.                  PT Education - 04/11/21 1646     Education Details TPDN, using foam roller for SO release in neck and for thoracic extension    Person(s) Educated Patient    Methods Explanation;Demonstration;Tactile cues;Verbal cues   pt denied need for updated handout to include FR   Comprehension Returned demonstration;Verbalized understanding;Verbal cues required;Tactile cues required               PT Short Term Goals - 03/29/21 1408       PT SHORT TERM GOAL #1   Title Pt will achieve BIL global scapular strength of 5/5 in order to promote proper head/ neck posture in the prophylaxis of cervical pain.    Baseline 4/5    Time 4    Period Weeks    Status On-going    Target Date 03/29/21      PT SHORT TERM GOAL #2   Title Pt will achieve FOTO score of ___ in order to demonstrate improved functional ability as it relates to her neck pain.    Baseline ___    Time 4    Period Weeks    Status Deferred    Target Date 03/29/21      PT SHORT TERM GOAL #3   Title Pt will achieve a deep neck flexor endurance test score of 50 seconds in order to promote active cervical reinforcement in the prevention of future pain.    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    Baseline 3x/week; 2x/week (03/29/2021)    Time 4    Period Weeks    Status Achieved    Target Date 03/29/21      PT SHORT TERM GOAL #5   Title *New* Pt will report 2 consecutive weeks without a headache in order to not be distracted while at work due to pain.    Baseline Pt reports headaches about 2 days per week    Time 6    Period Weeks    Status New    Target Date 05/10/21                      Plan - 04/11/21 1643     Clinical Impression Statement Lauren [name] presents with no pain today, reporting she is having a good day, but does still experience tension headaches and tenderness in her neck. TPDN was performed on R splenius capitus at C4-5 spinal level with pt experiencing ~3 twitch reponses and palpably smoother tissue following treatment. Due to increase in tension after last session, did not do any further DN today. Worked wiht foam roller today to increase thoracic extension  mobility, as well as improve diaphragmatic breathing with pt verbalizing feeling her core working. Introduced high density foam roller for cervical SO release to address tension headaches at home as needed.    Personal Factors and Comorbidities Time since onset of injury/illness/exacerbation    Examination-Activity Limitations Sleep  Examination-Participation Restrictions Community Activity;Occupation    Stability/Clinical Decision Making Evolving/Moderate complexity    Rehab Potential Good    PT Frequency 1x / week    PT Duration 6 weeks    PT Treatment/Interventions ADLs/Self Care Home Management;Moist Heat;Taping;Dry needling;Manual techniques;Neuromuscular re-education;Therapeutic exercise;Therapeutic activities    PT Next Visit Plan progress DNF/ scapular strengthening, TPDN PRN    PT Home Exercise Plan [email or web address]    Consulted and Agree with Plan of Care Patient             Patient will benefit from skilled therapeutic intervention in order to improve the following deficits and impairments:  Decreased strength, Impaired flexibility, Hypomobility, Pain  Visit Diagnosis: Cervicalgia  Muscle weakness (generalized)     Problem List Patient Active Problem List   Diagnosis Date Noted   Chronic migraine w/o aura w/o status migrainosus, not intractable 07/19/2020   Hyperlipidemia 04/28/2016   PAC (premature atrial contraction) 04/28/2016   PVC (premature ventricular contraction) 04/28/2016   Headache, menstrual migraine 01/09/2014   CHEST PAIN 06/06/2010   ESSENTIAL HYPERTENSION, BENIGN 04/03/2010   CARDIOMYOPATHY 11/15/2008   PARESTHESIA 11/15/2008   PALPITATIONS 11/15/2008    Marcelline Mates, PT, DPT 04/11/2021, 4:52 PM  [facility] [address] [place], [place], [address] Phone: [phone]   Fax:  [phone]  Name: Lauren [name] MRN: [id number] Date of Birth: 1965-11-26  PHYSICAL THERAPY DISCHARGE  SUMMARY  Visits from Start of Care: 5  Current functional level related to goals / functional outcomes: Unable to assess   Remaining deficits: Unable to assess   Education / Equipment: HEP   Patient agrees to discharge. Patient goals were not met. Patient is being discharged due to not returning since the last visit.  Carmelina Dane, PT, DPT 06/04/21 6:39 PM

## 2021-04-19 ENCOUNTER — Encounter: Payer: 59 | Admitting: Physical Therapy

## 2021-04-23 ENCOUNTER — Ambulatory Visit: Payer: 59 | Admitting: Physical Therapy

## 2021-05-20 ENCOUNTER — Ambulatory Visit (HOSPITAL_BASED_OUTPATIENT_CLINIC_OR_DEPARTMENT_OTHER): Payer: 59 | Admitting: Cardiovascular Disease

## 2021-06-10 ENCOUNTER — Telehealth: Payer: Self-pay | Admitting: *Deleted

## 2021-06-10 NOTE — Telephone Encounter (Signed)
PA for Emgality 120mg  started on covermymeds (key: [id number]). She now has pharmacy coverage through [place] [phone]). Decision pending.

## 2021-06-13 NOTE — Telephone Encounter (Signed)
Case [id number] approved through 12/10/2021.

## 2021-06-20 NOTE — Progress Notes (Signed)
Office Visit    Patient Name: Lauren SowRebecca A Reis Date of Encounter: 06/21/2021  PCP:  Darrow BussingKoirala, Dibas, MD   [facility]  Cardiologist:  Chilton Siiffany Belknap, MD  Advanced Practice Provider:  No care team member to display Electrophysiologist:  None    Chief Complaint    Lauren [name] is a 56 y.o. 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   Past Medical History    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    Cardiomyopathy    post-partum 2002   Dizziness    History of cardiomyopathy    Hyperlipidemia 04/28/2016   Iron deficiency anemia    Menstrual migraine    PAC (premature atrial contraction) 04/28/2016   Pain of cervical spine    Palpitations    Paresthesia    PVC (premature ventricular contraction) 04/28/2016   Right ear pain    Past Surgical History:  Procedure Laterality Date   echocardiogram stress test  12/30/10   TRANSTHORACIC ECHOCARDIOGRAM  03/11/06    Allergies  Allergies  Allergen Reactions   Doxycycline Rash    History of Present Illness    Lauren SowRebecca A Burciaga is a 56 y.o. 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   The patient had a peripartum cardiomyopathy in 2002.  She reports that after delivering her baby her ejection fraction reduced to 15%.  She also had the flu at that time.  She subsequently had complete recovery of ventricular function.  She had PVCs when her ejection fraction was low but this subsequently improved.  She was seen 03/2016 for recurrent palpitations.  She wore a 7-day event monitor which revealed PACs and PVCs.  She was started on metoprolol.  She also reported atypical chest pain that never occurred with exertion.  She followed up with Edd FabianJesse Cleaver, NP on 6/21 and was doing  well.  At her last follow-up appointment on 02/08/2021 she noted occasional palpitations that happen a couple times a month with duration of 5 seconds.  She recently had not had any.  She started to notice these episodes in the beginning of August 2022 when she was sitting down at dinner and drinking alcohol.  She exercises 4-5 times a week with walking or running.  She denies any chest pain or shortness of breath.  She drinks 1 to 2 cups of coffee a day.  Today, she states that she runs/walks 3 miles, 3 times a week.  She also does strength training and yoga occasionally.  She has had PACs and PVCs for 20 years.  She has never felt any of the palpitations while exercising.  She endorses feeling dizzy/lightheaded while watching TV and then she feels her pulse on her neck which feels faint.  She also has experienced feeling like her heart is racing and then getting lightheaded.  Usually goes away after 5 to 10 seconds.  She has not had any chest pain or shortness of breath.  She has not had any syncope or near syncope.  The last time that she wore a ZIO event monitor was in 2017.  She had an echocardiogram back in September 2022 which showed normal ejection fraction and no significant valvular disease. She has previously been on anxiety medication but is now only on L-theanine 200mg  daily for her anxiety and she feels  like it helps.   Reports no shortness of breath nor dyspnea on exertion. Reports no chest pain, pressure, or tightness. No edema, orthopnea, PND.  EKGs/Labs/Other Studies Reviewed:   The following studies were reviewed today:  7 Day Event Monitor 03/13/2016   Quality: Fair.  Baseline artifact. Predominant rhythm: sinus rhythm Average heart rate: 71 bpm   Pauses >2.5 seconds: 0   PVCs and PACs noted     Tiffany C. Duke Salvia, MD, [facility] 03/25/2016 8:11 AM   Echocardiogram 02/15/2021 IMPRESSIONS     1. Left ventricular ejection fraction, by estimation, is 55 to 60%. The  left  ventricle has normal function. The left ventricle has no regional  wall motion abnormalities. Left ventricular diastolic parameters were  normal. The average left ventricular  global longitudinal strain is -19.6 %. The global longitudinal strain is  normal.   2. Right ventricular systolic function is normal. The right ventricular  size is normal.   3. The mitral valve is normal in structure. No evidence of mitral valve  regurgitation. No evidence of mitral stenosis.   4. The aortic valve is tricuspid. Aortic valve regurgitation is mild. No  aortic stenosis is present.   5. The inferior vena cava is normal in size with greater than 50%  respiratory variability, suggesting right atrial pressure of 3 mmHg.   Comparison(s): Stress ECG conclusions: The stress ECG was normal.  - Baseline: LV global systolic function was normal. The estimated LV  ejection fraction was 55%. Normal wall motion; no LV regional wall motion  abnormalities.   EKG:  EKG is not ordered today.   Recent Labs: 02/15/2021: ALT 11; BUN 16; Creatinine, Ser 0.67; Hemoglobin 13.6; Magnesium 2.1; Platelets 207; Potassium 4.4; Sodium 139; TSH 0.567  Recent Lipid Panel    Component Value Date/Time   CHOL 213 (H) 02/15/2021 0933   TRIG 91 02/15/2021 0933   HDL 80 02/15/2021 0933   CHOLHDL 2.7 02/15/2021 0933   CHOLHDL 2.6 03/13/2016 0943   VLDL 14 03/13/2016 0943   LDLCALC 117 (H) 02/15/2021 0933     Home Medications   Current Meds  Medication Sig   Cholecalciferol (VITAMIN D3 PO) Take 5,000 Units by mouth daily.   Estradiol-Progesterone (BIJUVA) 1-100 MG CAPS Take 1 capsule by mouth daily.   Galcanezumab-gnlm (EMGALITY) 120 MG/ML SOAJ Inject 120 mg into the skin every 30 (thirty) days.   L-Theanine 200 MG CAPS Take 200 mg by mouth.   metoprolol succinate (TOPROL XL) 25 MG 24 hr tablet Take 1 tablet (25 mg total) by mouth daily.   Multiple Vitamin (MULTIVITAMIN) capsule Take 1 capsule by mouth daily.   Omega-3 1000  MG CAPS Take by mouth.   SUMAtriptan (IMITREX) 100 MG tablet Take 1 tablet (100 mg total) by mouth once as needed for up to 1 dose for migraine. May repeat in 2 hours if headache persists or recurs once.     Review of Systems      All other systems reviewed and are otherwise negative except as noted above.  Physical Exam    VS:  BP 120/76    Pulse 62    Ht 5\' 4"  (1.626 m)    Wt 117 lb 9.6 oz (53.3 kg)    LMP 10/13/2013    SpO2 98%    BMI 20.19 kg/m  , BMI Body mass index is 20.19 kg/m.  Wt Readings from Last 3 Encounters:  06/21/21 117 lb 9.6 oz (53.3 kg)  02/08/21 119 lb 1.6 oz (  54 kg)  07/19/20 116 lb (52.6 kg)     GEN: Well nourished, well developed, in no acute distress. HEENT: normal. Cardiac: RRR, no murmurs, rubs, or gallops. No clubbing, cyanosis, edema.  Radials/PT 2+ and equal bilaterally.  Respiratory:  Respirations regular and unlabored, clear to auscultation bilaterally. GI: Soft, nontender, nondistended. MS: No deformity or atrophy. Skin: Warm and dry, no rash. Neuro:  Strength and sensation are intact. Psych: Normal affect.  Assessment & Plan    Cardiomyopathy -Peripartum with EF down to 15% back in 2002 -Recent echocardiogram on 02/15/2021 showed EF 55-60%, no significant valvular disease  Palpitations -Continue Toprol-XL 25 mg daily -She has known PACs and PVCs -TSH recently checked which was 0.567 -Last CBC was normal -would recommend 14 day Zio monitor since her last monitor was in 2017 and now she is having new symptoms -She did not have the monitor placed today because she wants to check with her insurance.   Hyperlipidemia -Recent lipid panel 01/2021 showed HDL 88, LDL 117, triglycerides 91, and total cholesterol 213 -No current medication changes  -Repeat lipids in one year  Disposition: Follow up 6-8 weeks  with Chilton Si, MD or APP.  Signed, Sharlene Dory, PA-C 06/21/2021, 3:56 PM [facility]

## 2021-06-21 ENCOUNTER — Ambulatory Visit (HOSPITAL_BASED_OUTPATIENT_CLINIC_OR_DEPARTMENT_OTHER): Payer: Managed Care, Other (non HMO) | Admitting: Physician Assistant

## 2021-06-21 ENCOUNTER — Ambulatory Visit (HOSPITAL_BASED_OUTPATIENT_CLINIC_OR_DEPARTMENT_OTHER): Payer: Managed Care, Other (non HMO)

## 2021-06-21 ENCOUNTER — Other Ambulatory Visit: Payer: Self-pay

## 2021-06-21 ENCOUNTER — Encounter (HOSPITAL_BASED_OUTPATIENT_CLINIC_OR_DEPARTMENT_OTHER): Payer: Self-pay | Admitting: Physician Assistant

## 2021-06-21 VITALS — BP 120/76 | HR 62 | Ht 64.0 in | Wt 117.6 lb

## 2021-06-21 DIAGNOSIS — I491 Atrial premature depolarization: Secondary | ICD-10-CM | POA: Diagnosis not present

## 2021-06-21 DIAGNOSIS — R002 Palpitations: Secondary | ICD-10-CM

## 2021-06-21 DIAGNOSIS — I493 Ventricular premature depolarization: Secondary | ICD-10-CM

## 2021-06-21 DIAGNOSIS — O903 Peripartum cardiomyopathy: Secondary | ICD-10-CM

## 2021-06-21 DIAGNOSIS — E785 Hyperlipidemia, unspecified: Secondary | ICD-10-CM

## 2021-06-21 NOTE — Patient Instructions (Signed)
Medication Instructions:  Your Physician recommend you continue on your current medication as directed.    *If you need a refill on your cardiac medications before your next appointment, please call your pharmacy*   Lab Work: None today    Testing/Procedures: Your physician has recommended that you wear a Zio monitor.   This monitor is a medical device that records the hearts electrical activity. Doctors most often use these monitors to diagnose arrhythmias. Arrhythmias are problems with the speed or rhythm of the heartbeat. The monitor is a small device applied to your chest. You can wear one while you do your normal daily activities. While wearing this monitor if you have any symptoms to push the button and record what you felt. Once you have worn this monitor for the period of time provider prescribed (Usually 14 days), you will return the monitor device in the postage paid box. Once it is returned they will download the data collected and provide [place] with a report which the provider will then review and we will call you with those results. Important tips:  Avoid showering during the first 24 hours of wearing the monitor. Avoid excessive sweating to help maximize wear time. Do not submerge the device, no hot tubs, and no swimming pools. Keep any lotions or oils away from the patch. After 24 hours you may shower with the patch on. Take brief showers with your back facing the shower head.  Do not remove patch once it has been placed because that will interrupt data and decrease adhesive wear time. Push the button when you have any symptoms and write down what you were feeling. Once you have completed wearing your monitor, remove and place into box which has postage paid and place in your outgoing mailbox.  If for some reason you have misplaced your box then call our office and we can provide another box and/or mail it off for you.      Follow-Up: At [facility], you and your health  needs are our priority.  As part of our continuing mission to provide you with exceptional heart care, we have created designated Provider Care Teams.  These Care Teams include your primary Cardiologist (physician) and Advanced Practice Providers (APPs -  Physician Assistants and Nurse Practitioners) who all work together to provide you with the care you need, when you need it.  We recommend signing up for the patient portal called "MyChart".  Sign up information is provided on this After Visit Summary.  MyChart is used to connect with patients for Virtual Visits (Telemedicine).  Patients are able to view lab/test results, encounter notes, upcoming appointments, etc.  Non-urgent messages can be sent to your provider as well.   To learn more about what you can do with MyChart, go to [email or web address].    Your next appointment:   6-8 week(s)  The format for your next appointment:   In Person  Provider:   Chilton Si, MD, Gillian Shields, NP, or Edd Fabian, NP

## 2021-07-31 ENCOUNTER — Encounter (HOSPITAL_BASED_OUTPATIENT_CLINIC_OR_DEPARTMENT_OTHER): Payer: Self-pay | Admitting: Cardiovascular Disease

## 2021-07-31 DIAGNOSIS — R002 Palpitations: Secondary | ICD-10-CM

## 2021-08-01 ENCOUNTER — Ambulatory Visit (INDEPENDENT_AMBULATORY_CARE_PROVIDER_SITE_OTHER): Payer: Managed Care, Other (non HMO)

## 2021-08-01 DIAGNOSIS — R002 Palpitations: Secondary | ICD-10-CM

## 2021-08-01 NOTE — Telephone Encounter (Signed)
Please confirm that it is okay to order 14 day zio for this patient. Let me know what diagnosis to use and I can get the order put in for you and get it mailed to the patient ?

## 2021-08-01 NOTE — Progress Notes (Unsigned)
Enrolled for [employer] to mail a ZIO XT long term holter monitor to the patients address on file.   Dr. Myrtle Grove to read. 

## 2021-08-12 DIAGNOSIS — R002 Palpitations: Secondary | ICD-10-CM | POA: Diagnosis not present

## 2021-08-20 ENCOUNTER — Ambulatory Visit (HOSPITAL_BASED_OUTPATIENT_CLINIC_OR_DEPARTMENT_OTHER): Payer: Managed Care, Other (non HMO) | Admitting: Family

## 2021-09-05 ENCOUNTER — Telehealth (HOSPITAL_BASED_OUTPATIENT_CLINIC_OR_DEPARTMENT_OTHER): Payer: Self-pay | Admitting: Cardiovascular Disease

## 2021-09-05 DIAGNOSIS — R072 Precordial pain: Secondary | ICD-10-CM

## 2021-09-05 NOTE — Telephone Encounter (Signed)
?  Per MyChart scheduling message: ? ?Pt c/o of Chest Pain: STAT if CP now or developed within 24 hours ? ?1. Are you having CP right now?  ? ?2. Are you experiencing any other symptoms (ex. SOB, nausea, vomiting, sweating)?  ? ?3. How long have you been experiencing CP?  ? ?4. Is your CP continuous or coming and going?  ? ?5. Have you taken Nitroglycerin?  ? ? ?I am not having chest pain currently. It has woken me up a couple of early morning this week. It is not constant, last a few seconds and returns intermittently for maybe at a time. I have been able to exercise, run as usual without any chest pain. I do not have nitro.No other symptoms ??  ?

## 2021-09-05 NOTE — Telephone Encounter (Signed)
Please advise- no current chest pain  ?

## 2021-09-05 NOTE — Telephone Encounter (Signed)
OV 06/21/21 for palpitations. Preliminary report of ZIO monitor NSR with only 2 short runs of a fast heart beat called SVT which are not of concern. PAC/PVC were rare <1%. Echo 9/202 normal LVEF, no wall motion abnormalities. Her chest pain is atypical for angina as it does not occur with activity.  ? ?Please inquire if any shortness of breath, acid reflux? Any shortness of breath or palpitations? ? ?If wishes, could proceed with coronary calcium scoring ($99 out of pocket cost) to assess for any coronary calcification.  ? ?Otherwise, recommend office visit for further evaluation . ? ?No history of coronary disease, no indication for nitroglycerin at this time.  ? ?Lauren Sorrow, NP  ?

## 2021-09-06 NOTE — Telephone Encounter (Signed)
Rn returning call to patient to follow up on chest pain encounter.  ? ? ?No SHOB or acid reflux  ? ?Presented options from Gillian Shields, NP recommendations and the patient would like to have a Coronary Calcium Score at this time. She understands that it is a self pay test, RN to place order and route to brittany lynch for assistance with scheduling at this time.  ?

## 2021-09-13 ENCOUNTER — Telehealth (HOSPITAL_BASED_OUTPATIENT_CLINIC_OR_DEPARTMENT_OTHER): Payer: Self-pay

## 2021-09-13 ENCOUNTER — Ambulatory Visit (HOSPITAL_BASED_OUTPATIENT_CLINIC_OR_DEPARTMENT_OTHER)
Admission: RE | Admit: 2021-09-13 | Discharge: 2021-09-13 | Disposition: A | Discharge status: Home / Self Care | Payer: Managed Care, Other (non HMO) | Source: Ambulatory Visit | Attending: Family | Admitting: Family

## 2021-09-13 DIAGNOSIS — R072 Precordial pain: Secondary | ICD-10-CM | POA: Insufficient documentation

## 2021-09-13 MED ORDER — NITROGLYCERIN 0.4 MG SL SUBL: 0.4000 mg | SUBLINGUAL_TABLET | SUBLINGUAL | 4 refills | Status: DC | PRN

## 2021-09-13 MED ORDER — ASPIRIN EC 81 MG PO TBEC: 81.0000 mg | DELAYED_RELEASE_TABLET | Freq: Every day | ORAL | 3 refills | Status: DC

## 2021-09-13 MED ORDER — ROSUVASTATIN CALCIUM 20 MG PO TABS: 20.0000 mg | ORAL_TABLET | Freq: Every day | ORAL | 3 refills | Status: DC

## 2021-09-13 NOTE — Addendum Note (Signed)
Addended by: Marlene Lard on: 09/13/2021 01:21 PM ? ? Modules accepted: Orders ? ?

## 2021-09-13 NOTE — Telephone Encounter (Signed)
Patient responded to [employer] and does not wish to use Nitro or start a statin at this time.  ? ?Routing back to Gillian Shields, NP as an FYI ?

## 2021-09-13 NOTE — Telephone Encounter (Addendum)
Patient viewed results, will send in prescriptions and inform patient to call the office to get a follow up scheduled.  ? ? ?----- Message from Loel Dubonnet, NP sent at 09/13/2021 12:52 PM EDT ----- ?Coronary calcium score of 7. This means there is some plaque in the heart arteries but overall a low score. Low suspicion her chest pain waking her from sleep is related to this plaque build up. It does reveal risk for further cardiovascular disease. Recommend start Aspirin EC 81mg  QD and Rosuvastatin 20mg  daily for secondary prevention. Start nitroglycerin PRN for chest pain. Recommend follow up visit within next 2-3 mos to discuss. ?

## 2021-09-13 NOTE — Telephone Encounter (Signed)
FYI

## 2021-10-08 ENCOUNTER — Encounter: Payer: Self-pay | Admitting: Neurology

## 2021-10-08 ENCOUNTER — Ambulatory Visit: Payer: Commercial Managed Care - HMO | Admitting: Neurology

## 2021-10-08 VITALS — BP 102/69 | HR 74 | Ht 64.0 in | Wt 113.8 lb

## 2021-10-08 DIAGNOSIS — G43709 Chronic migraine without aura, not intractable, without status migrainosus: Secondary | ICD-10-CM | POA: Diagnosis not present

## 2021-10-08 MED ORDER — EMGALITY 120 MG/ML ~~LOC~~ SOAJ: 120.0000 mg | SUBCUTANEOUS | 11 refills | Status: DC

## 2021-10-08 MED ORDER — TIZANIDINE HCL 2 MG PO TABS
2.0000 mg | ORAL_TABLET | Freq: Three times a day (TID) | ORAL | 11 refills | Status: DC | PRN
Start: 2021-10-08 — End: 2022-10-14
  Filled 2021-12-26 – 2022-07-15 (×3): qty 30, 10d supply, fill #0

## 2021-10-08 MED ORDER — SUMATRIPTAN SUCCINATE 100 MG PO TABS
100.0000 mg | ORAL_TABLET | Freq: Once | ORAL | 11 refills | Status: DC | PRN
  Filled 2021-12-26: qty 10, 30d supply, fill #0
  Filled 2022-02-08: qty 9, 30d supply, fill #0
  Filled 2022-03-22 – 2022-03-24 (×2): qty 9, 30d supply, fill #1
  Filled 2022-05-18: qty 9, 30d supply, fill #2
  Filled 2022-06-19: qty 9, 30d supply, fill #3
  Filled 2022-07-30: qty 9, 30d supply, fill #4
  Filled 2022-08-25 – 2022-08-27 (×2): qty 9, 30d supply, fill #5
  Filled 2022-09-20: qty 9, 30d supply, fill #6

## 2021-10-08 NOTE — Progress Notes (Signed)
?[facility] ? ? ? ?Provider:  Dr Jaynee Eagles ?Requesting Provider: Lujean Amel, MD ?Primary Care Provider:  Lujean Amel, MD ? ?CC:  chronic migraines ? ?HPI:  Lauren [name] is a 56 y.o. female here as requested by Lujean Amel, MD for chronic migraines.  ? ?Interval history 10/08/2021:  This is a former Dr. Jannifer Franklin patient with migraines transitioning to me. She was started on Emgality but unclear why she has not been able to get it. Emgality worked Engineer, manufacturing. She has neck spasms, in the past tizanidine and a medrol dosepak has helped saw Butler Denmark NP in the past, also has occipital headaches/neuralgia. Prior to [facility] she had daily headaches and at least 15 migraine days a month that were moderate to severe. Emgality brought her down > 50% and was only taking 2-4 days of imitrex a month and the migraines she did have were less severe. Imitrex significantly helps acutely.  Aimovig is contraindicated due to cardiovascular risks in this patient but if can't get the emgality would switch to Ajovy with the copay card.  ? ?Patient complains of symptoms per HPI as well as the following symptoms: cardiomyopathy . Pertinent negatives and positives per HPI. All others negative ? ?Imaging: MRI cervical spine 2014: reviewed report ?Impression  ?Abnormal MRI scan of cervical spine showing mild disc  ?degenerative changes from C3-C6 but without frank disc herniation or  ?significant compression   ? ? ?History of Present Illness last appointment wih Dr. Jannifer Franklin 12/2020: ?Lauren [name] is a 56 year old patient with a history of intractable migraine headache.  She initially got a good response with beta-blockers, but within the last year her headaches have worsened.  She was switched to [facility] with initial good improvement but over the last months she has had daily headaches.  The headaches are now coming mainly from the left neck and shoulder into the back of the head, she does have some neck stiffness  with this.  She denies any nausea or vomiting or any photophobia or phonophobia but certain sweet odors may bother her during the headache.  She may have some tingling intermittently on the left hand.  In 2014 cervical spine MRI did not show evidence of nerve root impingement.  The patient will take Imitrex with some benefit with the headache but she cannot take this daily.  Medication such as Advil or Excedrin Migraine have not been effective.  By doing some stretching exercises, she is able to keep the headache to a minimum. ?  ?Patient complains of symptoms per HPI as well as the following symptoms: hx of cardiomyopathy . Pertinent negatives and positives per HPI. All others negative ? ? ? ?Social History  ? ?Socioeconomic History  ? Marital status: Married  ?  Spouse name: Jenny Reichmann  ? Number of children: 2  ? Years of education: 77  ? Highest education level: Not on file  ?Occupational History  ? Occupation: Physical therapy assistant  ?  Employer: PRN with [employer]  ?  Employer: UNEMPLOYED  ?Tobacco Use  ? Smoking status: Former  ?  Types: Cigarettes  ?  Quit date: 06/02/1989  ?  Years since quitting: 32.3  ? Smokeless tobacco: Never  ? Tobacco comments:  ?  started at 16, smoked less than 1 ppd; quit in 1990  ?Substance and Sexual Activity  ? Alcohol use: Yes  ?  Alcohol/week: 0.0 standard drinks  ?  Comment: approximately 3-5 drinks/week   ? Drug use: No  ? Sexual activity: Yes  ?  Partners: Male  ?Other Topics Concern  ? Not on file  ?Social History Narrative  ? Patient is Married ([name]) and lives at home with her family.  ? Patient has two children, 2 children (7 and 5)  ? Physical therapist assistant .  ? Patient has a Associates degree.  ? Patient is left-handed.  ? Patient drinks one cup of coffee and two cups per day.  ? ?Social Determinants of Health  ? ?Financial Resource Strain: Not on file  ?Food Insecurity: Not on file  ?Transportation Needs: Not on file  ?Physical Activity: Not on file  ?Stress: Not on file   ?Social Connections: Not on file  ?Intimate Partner Violence: Not on file  ? ? ?Family History  ?Problem Relation Age of Onset  ? Cancer Maternal Grandmother   ?     reproductive organs  ? Cancer Paternal Grandmother   ? Atrial fibrillation Mother   ? Heart failure Mother   ? CAD Mother   ? Hypertension Father   ? Atrial fibrillation Father   ? Peripheral Artery Disease Father   ? Diabetes Father   ? ? ?Past Medical History:  ?Diagnosis Date  ? 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 Cardiomyopathy   ? post-partum 2002  ? Dizziness   ? History of cardiomyopathy   ? Hyperlipidemia 04/28/2016  ? Iron deficiency anemia   ? Menstrual migraine   ? PAC (premature atrial contraction) 04/28/2016  ? Pain of cervical spine   ? Palpitations   ? Paresthesia   ? PVC (premature ventricular contraction) 04/28/2016  ? Right ear pain   ? ? ?Patient Active Problem List  ? Diagnosis Date Noted  ? Chronic migraine w/o aura w/o status migrainosus, not intractable 07/19/2020  ? Hyperlipidemia 04/28/2016  ? PAC (premature atrial contraction) 04/28/2016  ? PVC (premature ventricular contraction) 04/28/2016  ? Headache, menstrual migraine 01/09/2014  ? CHEST PAIN 06/06/2010  ? ESSENTIAL HYPERTENSION, BENIGN 04/03/2010  ? CARDIOMYOPATHY 11/15/2008  ? PARESTHESIA 11/15/2008  ? PALPITATIONS 11/15/2008  ? ? ?Past Surgical History:  ?Procedure Laterality Date  ? echocardiogram stress test  12/30/10  ? TRANSTHORACIC ECHOCARDIOGRAM  03/11/06  ? ? ?Current Outpatient Medications  ?Medication Sig Dispense Refill  ? Cholecalciferol (VITAMIN D3 PO) Take 5,000 Units by mouth daily.    ? estradiol (VIVELLE-DOT) 0.05 MG/24HR patch 1 patch 2 (two) times a week.    ? Estradiol-Progesterone (BIJUVA) 1-100 MG CAPS Take 1 capsule by mouth daily. 30 capsule   ? L-Theanine 200 MG CAPS Take 200 mg by mouth.    ? metoprolol succinate (TOPROL XL) 25 MG 24 hr tablet Take 1 tablet (25 mg total) by mouth daily. 90 tablet 3  ? Multiple Vitamin  (MULTIVITAMIN) capsule Take 1 capsule by mouth daily.    ? Omega-3 1000 MG CAPS Take by mouth daily.    ? progesterone (PROMETRIUM) 100 MG capsule Take 100 mg by mouth at bedtime.    ? Galcanezumab-gnlm (EMGALITY) 120 MG/ML SOAJ Inject 120 mg into the skin every 30 (thirty) days. 1 mL 11  ? SUMAtriptan (IMITREX) 100 MG tablet Take 1 tablet (100 mg total) by mouth once as needed for up to 1 dose for migraine. May repeat in 2 hours if headache persists or recurs once. 10 tablet 11  ? tiZANidine (ZANAFLEX) 2 MG tablet Take 1 tablet (2 mg total) by mouth every 8 (eight) hours as needed. 30 tablet 11  ? ?No current facility-administered medications for  this visit.  ? ? ?Allergies as of 10/08/2021 - Review Complete 10/08/2021  ?Allergen Reaction Noted  ? Doxycycline Rash   ? ? ?Vitals: ?BP 102/69   Pulse 74   Ht 5\' 4"  (1.626 m)   Wt 113 lb 12.8 oz (51.6 kg)   LMP 10/13/2013   BMI 19.53 kg/m?  ?Last Weight:  ?Wt Readings from Last 1 Encounters:  ?10/08/21 113 lb 12.8 oz (51.6 kg)  ? ?Last Height:   ?Ht Readings from Last 1 Encounters:  ?10/08/21 5\' 4"  (1.626 m)  ? ? ?Exam: ?NAD, pleasant                  ?Speech: ?   Speech is normal; fluent and spontaneous with normal comprehension.  ?Cognition: ?   The patient is oriented to person, place, and time;  ?   recent and remote memory intact;  ?   language fluent;  ?  Cranial Nerves: ?   The pupils are equal, round, and reactive to light.Trigeminal sensation is intact and the muscles of mastication are normal. The face is symmetric. The palate elevates in the midline. Hearing intact. Voice is normal. Shoulder shrug is normal. The tongue has normal motion without fasciculations.  ? ?Coordination: ? No dysmetria ? ?Motor Observation: ?   No asymmetry, no atrophy, and no involuntary movements noted. ?Tone: ?   Normal muscle tone.   ?  ?Strength: ?   Strength is V/V in the upper and lower limbs.  ?    ?Sensation: intact to LT ? ? ?Assessment/Plan:  This is a former Dr. Jannifer Franklin  patient with migraines transitioning to me.  She has a past medical history of chronic migraines, hyperlipidemia, PAC, PVC, hypertension, cardiomyopathy, paresthesias, occipital headache/neuropathy.  [place]rio

## 2021-10-08 NOTE — Patient Instructions (Signed)
Continue current medications. 

## 2021-11-02 ENCOUNTER — Other Ambulatory Visit: Payer: Self-pay | Admitting: Neurology

## 2021-11-02 DIAGNOSIS — G43709 Chronic migraine without aura, not intractable, without status migrainosus: Secondary | ICD-10-CM

## 2021-11-07 ENCOUNTER — Encounter: Payer: Self-pay | Admitting: Neurology

## 2021-11-07 ENCOUNTER — Other Ambulatory Visit: Payer: Self-pay | Admitting: Neurology

## 2021-11-07 MED ORDER — METHYLPREDNISOLONE 4 MG PO TBPK: ORAL_TABLET | ORAL | 1 refills | Status: DC

## 2021-11-21 ENCOUNTER — Ambulatory Visit: Payer: Commercial Managed Care - HMO | Admitting: Neurology

## 2021-11-21 ENCOUNTER — Encounter: Payer: Self-pay | Admitting: Neurology

## 2021-11-21 VITALS — BP 132/82 | HR 66 | Ht 64.0 in | Wt 114.2 lb

## 2021-11-21 DIAGNOSIS — H532 Diplopia: Secondary | ICD-10-CM | POA: Diagnosis not present

## 2021-11-21 DIAGNOSIS — R200 Anesthesia of skin: Secondary | ICD-10-CM

## 2021-11-21 DIAGNOSIS — H538 Other visual disturbances: Secondary | ICD-10-CM | POA: Diagnosis not present

## 2021-11-21 DIAGNOSIS — G8929 Other chronic pain: Secondary | ICD-10-CM

## 2021-11-21 DIAGNOSIS — H539 Unspecified visual disturbance: Secondary | ICD-10-CM | POA: Diagnosis not present

## 2021-11-21 DIAGNOSIS — G4486 Cervicogenic headache: Secondary | ICD-10-CM

## 2021-11-21 DIAGNOSIS — M5412 Radiculopathy, cervical region: Secondary | ICD-10-CM

## 2021-11-21 DIAGNOSIS — H9192 Unspecified hearing loss, left ear: Secondary | ICD-10-CM

## 2021-11-21 DIAGNOSIS — M542 Cervicalgia: Secondary | ICD-10-CM

## 2021-11-21 DIAGNOSIS — G43709 Chronic migraine without aura, not intractable, without status migrainosus: Secondary | ICD-10-CM

## 2021-11-21 NOTE — Patient Instructions (Signed)
MRI brain w/wo contrast thin cuts through the ear canals MRI cervical spine Recommend eye exam - place some gel preservative free lubricant at bedtime Flonase or nasacort and use it in the nose daily Try some decongestant like Zyrtec-D

## 2021-11-21 NOTE — Progress Notes (Unsigned)
[facility]    Provider:  Dr Lucia Gaskins Requesting Provider: Darrow Bussing, MD Primary Care Provider:  Darrow Bussing, MD  CC:  chronic migraines  HPI:  Lauren [name] is a 56 y.o. female here as requested by Darrow Bussing, MD for chronic migraines.   11/21/2021: She has neck pain, she woke up, she has numbness in the fingers, she woke up her inky was burning and her left eye was crossed, she thought she was having a strossed, she couldn't focus with the left eye, that has happened 2x a week, only when she wakes up, can last 5 - 10 seconds, some morenings is fine, this morning wasn;t "crossed" but it wouldn't open the left eye, 2 weeks ago the left side of the face went numb, numbness in the hands, a round of prednisone did not help, now gradually she has muffled hearing in the left ear, feels like she has cotton in the left ear. She has chronic neck pain, she has numbness in her right hand off and on and numbness in digits 4-5, she has neck pain with radiation into the left arm and numbness in digits 4-5. Completed 3 months of physical therapy on the neck, has been under the care of physicians since 2014 for her neck, tried PT, muscle relaxers, heat, conservative measures, analgesics. Left pulsatile tinnitus, went to the ENT  TM with possibly a little layering in the lower part maybe some fluid  FINDINGS:  The cervical vertebrae to fall straight slight loss of forward lordotic  curvature with mild posterior subluxation of C3 and C4 over C2 vertebrae.  The intervertebral discs show loss of discitis and desiccation changes  from C2-C5 6 most prominent at C5-6 there is flattening of the skin of  described. The minimum disc urging centrally at C4-5 and C5-6 but without  frank disc herniation, cord compression or significant root or foraminal  stenosis. Spinal cord parenchyma itself shows normal signal  characteristics. Contrast images do not result in any abnormal areas of   enhancement The visualized portion of the upper thoracic spine and lower  brain stem and cerebellum appear unremarkable.       Impression  Abnormal MRI scan of cervical spine showing mild disc  degenerative changes from C3-C6 but without frank disc herniation or  significant compression      Interval history 10/08/2021:  This is a former Dr. Anne Hahn patient with migraines transitioning to me. She was started on Emgality but unclear why she has not been able to get it. Emgality worked Firefighter. She has neck spasms, in the past tizanidine and a medrol dosepak has helped saw Margie Ege NP in the past, also has occipital headaches/neuralgia. Prior to [facility] she had daily headaches and at least 15 migraine days a month that were moderate to severe. Emgality brought her down > 50% and was only taking 2-4 days of imitrex a month and the migraines she did have were less severe. Imitrex significantly helps acutely.  Aimovig is contraindicated due to cardiovascular risks in this patient but if can't get the emgality would switch to Ajovy with the copay card.   Patient complains of symptoms per HPI as well as the following symptoms: cardiomyopathy . Pertinent negatives and positives per HPI. All others negative  Imaging: MRI cervical spine 2014: reviewed report Impression  Abnormal MRI scan of cervical spine showing mild disc  degenerative changes from C3-C6 but without frank disc herniation or  significant compression     History  of Present Illness last appointment wih Dr. Anne Hahn 12/2020: Lauren [name] is a 56 year old patient with a history of intractable migraine headache.  She initially got a good response with beta-blockers, but within the last year her headaches have worsened.  She was switched to [facility] with initial good improvement but over the last months she has had daily headaches.  The headaches are now coming mainly from the left neck and shoulder into the back of the head, she does have  some neck stiffness with this.  She denies any nausea or vomiting or any photophobia or phonophobia but certain sweet odors may bother her during the headache.  She may have some tingling intermittently on the left hand.  In 2014 cervical spine MRI did not show evidence of nerve root impingement.  The patient will take Imitrex with some benefit with the headache but she cannot take this daily.  Medication such as Advil or Excedrin Migraine have not been effective.  By doing some stretching exercises, she is able to keep the headache to a minimum.   Patient complains of symptoms per HPI as well as the following symptoms: hx of cardiomyopathy . Pertinent negatives and positives per HPI. All others negative    Social History   Socioeconomic History   Marital status: Married    Spouse name: [name]   Number of children: 2   Years of education: 14   Highest education level: Not on file  Occupational History   Occupation: Physical therapy Electronics engineer: PRN with [employer]    Employer: UNEMPLOYED  Tobacco Use   Smoking status: Former    Types: Cigarettes    Quit date: 06/02/1989    Years since quitting: 32.4   Smokeless tobacco: Never   Tobacco comments:    started at 53, smoked less than 1 ppd; quit in 1990  Substance and Sexual Activity   Alcohol use: Yes    Alcohol/week: 0.0 standard drinks of alcohol    Comment: approximately 3-5 drinks/week    Drug use: No   Sexual activity: Yes    Partners: Male  Other Topics Concern   Not on file  Social History Narrative   Patient is Married ([name]) and lives at home with her family.   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   Patient is left-handed.   Patient drinks one cup of coffee and two cups per day.   Social Determinants of Health   Financial Resource Strain: Not on file  Food Insecurity: Not on file  Transportation Needs: Not on file  Physical Activity:  Not on file  Stress: Not on file  Social Connections: Not on file  Intimate Partner Violence: Not on file    Family History  Problem Relation Age of Onset   Cancer Maternal Grandmother        reproductive organs   Cancer Paternal Grandmother    Atrial fibrillation Mother    Heart failure Mother    CAD Mother    Hypertension Father    Atrial fibrillation Father    Peripheral Artery Disease Father    Diabetes Father     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    Cardiomyopathy    post-partum 2002   Dizziness    History of cardiomyopathy    Hyperlipidemia 04/28/2016   Iron deficiency anemia  Menstrual migraine    PAC (premature atrial contraction) 04/28/2016   Pain of cervical spine    Palpitations    Paresthesia    PVC (premature ventricular contraction) 04/28/2016   Right ear pain     Patient Active Problem List   Diagnosis Date Noted   Chronic migraine w/o aura w/o status migrainosus, not intractable 07/19/2020   Hyperlipidemia 04/28/2016   PAC (premature atrial contraction) 04/28/2016   PVC (premature ventricular contraction) 04/28/2016   Headache, menstrual migraine 01/09/2014   CHEST PAIN 06/06/2010   ESSENTIAL HYPERTENSION, BENIGN 04/03/2010   CARDIOMYOPATHY 11/15/2008   PARESTHESIA 11/15/2008   PALPITATIONS 11/15/2008    Past Surgical History:  Procedure Laterality Date   echocardiogram stress test  12/30/10   TRANSTHORACIC ECHOCARDIOGRAM  03/11/06    Current Outpatient Medications  Medication Sig Dispense Refill   Cholecalciferol (VITAMIN D3 PO) Take 5,000 Units by mouth daily.     estradiol (VIVELLE-DOT) 0.05 MG/24HR patch 1 patch 2 (two) times a week.     Estradiol-Progesterone (BIJUVA) 1-100 MG CAPS Take 1 capsule by mouth daily. 30 capsule    Galcanezumab-gnlm (EMGALITY) 120 MG/ML SOAJ Inject 120 mg into the skin every 30 (thirty) days. 1 mL 11   L-Theanine 200 MG CAPS Take 200 mg by  mouth.     methylPREDNISolone (MEDROL DOSEPAK) 4 MG TBPK tablet Take pills daily all together with food. Take the first dose (6 pills) as soon as possible. Take the rest each morning. For 6 days total 6-5-4-3-2-1. 21 tablet 1   metoprolol succinate (TOPROL XL) 25 MG 24 hr tablet Take 1 tablet (25 mg total) by mouth daily. 90 tablet 3   Multiple Vitamin (MULTIVITAMIN) capsule Take 1 capsule by mouth daily.     Omega-3 1000 MG CAPS Take by mouth daily.     progesterone (PROMETRIUM) 100 MG capsule Take 100 mg by mouth at bedtime.     SUMAtriptan (IMITREX) 100 MG tablet Take 1 tablet (100 mg total) by mouth once as needed for up to 1 dose for migraine. May repeat in 2 hours if headache persists or recurs once. 10 tablet 11   tiZANidine (ZANAFLEX) 2 MG tablet Take 1 tablet (2 mg total) by mouth every 8 (eight) hours as needed. 30 tablet 11   No current facility-administered medications for this visit.    Allergies as of 11/21/2021 - Review Complete 10/08/2021  Allergen Reaction Noted   Doxycycline Rash     Vitals: LMP 10/13/2013  Last Weight:  Wt Readings from Last 1 Encounters:  10/08/21 113 lb 12.8 oz (51.6 kg)   Last Height:   Ht Readings from Last 1 Encounters:  10/08/21 5\' 4"  (1.626 m)    Exam: NAD, pleasant                  Speech:    Speech is normal; fluent and spontaneous with normal comprehension.  Cognition:    The patient is oriented to person, place, and time;     recent and remote memory intact;     language fluent;    Cranial Nerves:    The pupils are equal, round, and reactive to light.Trigeminal sensation is intact and the muscles of mastication are normal. The face is symmetric. The palate elevates in the midline. Hearing intact. Voice is normal. Shoulder shrug is normal. The tongue has normal motion without fasciculations.   Coordination:  No dysmetria  Motor Observation:    No asymmetry, no atrophy, and no involuntary movements noted.  Tone:    Normal muscle  tone.     Strength:    Strength is V/V in the upper and lower limbs.      Sensation: intact to LT   Assessment/Plan:  This is a former Dr. Anne Hahn patient with migraines transitioning to me.  She has a past medical history of chronic migraines, hyperlipidemia, PAC, PVC, hypertension, cardiomyopathy, paresthesias, occipital headache/neuropathy.  Prior to [facility] she had daily headaches and at least 15 migraine days a month that were moderate to severe. Emgality brought her down > 50% and was only taking 2-4 days of imitrex a month and the migraines she did have were less severe. Imitrex significantly helps acutely.  Aimovig is contraindicated due to cardiovascular risks in this patient but if can't get the emgality would switch to Ajovy with the copay card.She has neck spasms, in the past tizanidine and a medrol dosepak has helped saw Margie Ege NP in the past, also has occipital headaches/neuralgia.  - continue current meds. If Emgality is a problem, can try Ajovy with the copay card. Aimovig contraindicated as above. Gave her 2 months samples emgality while we work out PA or any insurance issues she has been having.  - tizanidine for neck aspams - imitrex acutely  No orders of the defined types were placed in this encounter.   Cc: Darrow Bussing, MD,  Darrow Bussing, MD  Naomie Dean, MD  [facility] [address] [place], [place] [address]  Phone [phone] Fax [phone]  I spent over 30 minutes of face-to-face and non-face-to-face time with patient on the  No diagnosis found.  diagnosis.  This included previsit chart review, lab review, study review, order entry, electronic health record documentation, patient education on the different diagnostic and therapeutic options, counseling and coordination of care, risks and benefits of management, compliance, or risk factor reduction

## 2021-11-25 ENCOUNTER — Telehealth: Payer: Self-pay | Admitting: Neurology

## 2021-12-04 ENCOUNTER — Encounter: Payer: Self-pay | Admitting: Neurology

## 2021-12-04 MED ORDER — EMGALITY 120 MG/ML ~~LOC~~ SOAJ: 120.0000 mg | SUBCUTANEOUS | 0 refills | Status: DC

## 2021-12-04 NOTE — Addendum Note (Signed)
Addended by: Raynald Kemp A on: 12/04/2021 05:01 PM   Modules accepted: Orders

## 2021-12-07 ENCOUNTER — Other Ambulatory Visit: Payer: Managed Care, Other (non HMO)

## 2021-12-09 ENCOUNTER — Encounter: Payer: Self-pay | Admitting: *Deleted

## 2021-12-19 ENCOUNTER — Ambulatory Visit
Admission: RE | Admit: 2021-12-19 | Discharge: 2021-12-19 | Disposition: A | Discharge status: Home / Self Care | Payer: 59 | Source: Ambulatory Visit | Attending: Neurology | Admitting: Neurology

## 2021-12-19 ENCOUNTER — Telehealth: Payer: Self-pay | Admitting: *Deleted

## 2021-12-19 DIAGNOSIS — M542 Cervicalgia: Secondary | ICD-10-CM

## 2021-12-19 DIAGNOSIS — H532 Diplopia: Secondary | ICD-10-CM

## 2021-12-19 DIAGNOSIS — M5412 Radiculopathy, cervical region: Secondary | ICD-10-CM

## 2021-12-19 DIAGNOSIS — G8929 Other chronic pain: Secondary | ICD-10-CM

## 2021-12-19 DIAGNOSIS — H538 Other visual disturbances: Secondary | ICD-10-CM

## 2021-12-19 DIAGNOSIS — R200 Anesthesia of skin: Secondary | ICD-10-CM

## 2021-12-19 DIAGNOSIS — H539 Unspecified visual disturbance: Secondary | ICD-10-CM

## 2021-12-19 DIAGNOSIS — H9192 Unspecified hearing loss, left ear: Secondary | ICD-10-CM

## 2021-12-19 MED ORDER — GADOBENATE DIMEGLUMINE 529 MG/ML IV SOLN
10.0000 mL | Freq: Once | INTRAVENOUS | Status: AC | PRN
  Administered 2021-12-19: 10 mL via INTRAVENOUS

## 2021-12-19 NOTE — Telephone Encounter (Signed)
Attempted emgality PA on CMM, patient's [employer] is inactive. Called pharmacy help desk, spoke with Dorina Hoyer who stated she has active insurance through [facility] as of 11/30/21, ID [id number], BIN [id number], PCN [id number], Group [id number].  New PA, (Key: [id number], [id number]. Your information has been sent to [employer].

## 2021-12-20 ENCOUNTER — Other Ambulatory Visit (HOSPITAL_COMMUNITY): Payer: Self-pay

## 2021-12-20 MED ORDER — ROSUVASTATIN CALCIUM 5 MG PO TABS
5.0000 mg | ORAL_TABLET | Freq: Every day | ORAL | 11 refills | Status: DC
  Filled 2021-12-20: qty 30, 30d supply, fill #0
  Filled 2022-01-07 – 2022-01-20 (×3): qty 30, 30d supply, fill #1
  Filled 2022-02-16: qty 30, 30d supply, fill #2
  Filled 2022-03-20 – 2022-03-24 (×2): qty 30, 30d supply, fill #3
  Filled 2022-04-18: qty 30, 30d supply, fill #4
  Filled 2022-05-19: qty 30, 30d supply, fill #5
  Filled 2022-06-19: qty 30, 30d supply, fill #6
  Filled 2022-07-30: qty 30, 30d supply, fill #7
  Filled 2022-08-25 – 2022-08-27 (×2): qty 30, 30d supply, fill #8
  Filled 2022-09-16 – 2022-09-20 (×2): qty 30, 30d supply, fill #9
  Filled 2022-10-24: qty 30, 30d supply, fill #10
  Filled 2022-11-22: qty 30, 30d supply, fill #11

## 2021-12-23 ENCOUNTER — Other Ambulatory Visit: Payer: Self-pay | Admitting: Neurology

## 2021-12-23 ENCOUNTER — Telehealth: Payer: Self-pay

## 2021-12-23 DIAGNOSIS — M47892 Other spondylosis, cervical region: Secondary | ICD-10-CM

## 2021-12-23 NOTE — Telephone Encounter (Addendum)
Emgality approved through [employer] for loading dose and maintenance until 06/14/22. Auth # [id number]. Approval Letter faxed to pharmacy.

## 2021-12-23 NOTE — Telephone Encounter (Signed)
I called the pt and we reviewed results of MRI. She verbalized understanding and is agreeable to [facility] referral.   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 We can refer her to [facility] if she likes for trial of injections or other management.Or more PT. Please ask and I can order thanks

## 2021-12-25 ENCOUNTER — Telehealth: Payer: Self-pay | Admitting: Neurology

## 2021-12-25 ENCOUNTER — Other Ambulatory Visit (HOSPITAL_COMMUNITY): Payer: Self-pay

## 2021-12-25 NOTE — Telephone Encounter (Signed)
Referral sent to [facility] [phone]. 

## 2021-12-26 ENCOUNTER — Other Ambulatory Visit (HOSPITAL_COMMUNITY): Payer: Self-pay

## 2021-12-26 MED ORDER — PROGESTERONE MICRONIZED 100 MG PO CAPS
100.0000 mg | ORAL_CAPSULE | Freq: Every evening | ORAL | 2 refills | Status: DC
  Filled 2021-12-26 – 2022-01-20 (×2): qty 90, 90d supply, fill #0
  Filled 2022-04-18: qty 30, 30d supply, fill #1
  Filled 2022-05-19: qty 30, 30d supply, fill #2

## 2021-12-26 MED ORDER — ESTRADIOL 0.05 MG/24HR TD PTTW
1.0000 | MEDICATED_PATCH | TRANSDERMAL | 3 refills | Status: DC
  Filled 2021-12-26 – 2022-01-07 (×3): qty 24, 84d supply, fill #0
  Filled 2022-03-20 – 2022-03-24 (×2): qty 16, 56d supply, fill #1

## 2021-12-26 MED ORDER — EMGALITY 120 MG/ML ~~LOC~~ SOAJ
120.0000 mg | SUBCUTANEOUS | 11 refills | Status: DC
  Filled 2022-02-08: qty 1, 28d supply, fill #0
  Filled 2022-02-18: qty 1, 30d supply, fill #0
  Filled 2022-03-20 – 2022-03-24 (×2): qty 1, 30d supply, fill #1
  Filled 2022-04-18: qty 1, 30d supply, fill #2
  Filled 2022-05-18: qty 1, 30d supply, fill #3
  Filled 2022-06-19: qty 1, 30d supply, fill #4
  Filled 2022-07-30: qty 1, 30d supply, fill #5
  Filled 2022-08-25 – 2022-08-27 (×2): qty 1, 30d supply, fill #6
  Filled 2022-09-16 – 2022-09-24 (×2): qty 1, 30d supply, fill #7
  Filled 2022-10-13: qty 1, 30d supply, fill #8

## 2021-12-30 NOTE — Telephone Encounter (Signed)
Patient is scheduled 01/07/22.

## 2022-01-03 ENCOUNTER — Other Ambulatory Visit (HOSPITAL_COMMUNITY): Payer: Self-pay

## 2022-01-07 ENCOUNTER — Other Ambulatory Visit (HOSPITAL_COMMUNITY): Payer: Self-pay

## 2022-01-20 ENCOUNTER — Other Ambulatory Visit (HOSPITAL_COMMUNITY): Payer: Self-pay

## 2022-01-24 ENCOUNTER — Other Ambulatory Visit (HOSPITAL_COMMUNITY): Payer: Self-pay

## 2022-01-26 DIAGNOSIS — Z87891 Personal history of nicotine dependence: Secondary | ICD-10-CM | POA: Insufficient documentation

## 2022-01-26 DIAGNOSIS — R1031 Right lower quadrant pain: Secondary | ICD-10-CM | POA: Insufficient documentation

## 2022-01-26 DIAGNOSIS — I700 Atherosclerosis of aorta: Secondary | ICD-10-CM | POA: Insufficient documentation

## 2022-01-27 ENCOUNTER — Emergency Department (HOSPITAL_COMMUNITY)
Admission: EM | Admit: 2022-01-27 | Discharge: 2022-01-27 | Disposition: A | Discharge status: Home / Self Care | Payer: 59 | Attending: Emergency Medicine | Admitting: Emergency Medicine

## 2022-01-27 ENCOUNTER — Encounter (HOSPITAL_COMMUNITY): Payer: Self-pay

## 2022-01-27 ENCOUNTER — Other Ambulatory Visit: Payer: Self-pay

## 2022-01-27 ENCOUNTER — Emergency Department (HOSPITAL_COMMUNITY): Payer: 59

## 2022-01-27 DIAGNOSIS — R1031 Right lower quadrant pain: Secondary | ICD-10-CM

## 2022-01-27 LAB — COMPREHENSIVE METABOLIC PANEL
ALT: 25 U/L (ref 0–44)
AST: 28 U/L (ref 15–41)
Albumin: 4.2 g/dL (ref 3.5–5.0)
Alkaline Phosphatase: 47 U/L (ref 38–126)
Anion gap: 7 (ref 5–15)
BUN: 12 mg/dL (ref 6–20)
CO2: 26 mmol/L (ref 22–32)
Calcium: 9.8 mg/dL (ref 8.9–10.3)
Chloride: 110 mmol/L (ref 98–111)
Creatinine, Ser: 0.63 mg/dL (ref 0.44–1.00)
GFR, Estimated: >60 mL/min (ref >60)
Glucose, Bld: 112 mg/dL — ABNORMAL HIGH (ref 70–99)
Potassium: 3.3 mmol/L — ABNORMAL LOW (ref 3.5–5.1)
Sodium: 143 mmol/L (ref 135–145)
Total Bilirubin: 0.6 mg/dL (ref 0.3–1.2)
Total Protein: 7 g/dL (ref 6.5–8.1)

## 2022-01-27 LAB — URINALYSIS, ROUTINE W REFLEX MICROSCOPIC
Bacteria, UA: NONE SEEN
Bilirubin Urine: NEGATIVE
Glucose, UA: NEGATIVE mg/dL
Hgb urine dipstick: NEGATIVE
Ketones, ur: NEGATIVE mg/dL
Leukocytes,Ua: NEGATIVE
Nitrite: NEGATIVE
Protein, ur: NEGATIVE mg/dL
Specific Gravity, Urine: 1.012 (ref 1.005–1.030)
pH: 5 (ref 5.0–8.0)

## 2022-01-27 LAB — CBC
HCT: 40.9 % (ref 36.0–46.0)
Hemoglobin: 13.7 g/dL (ref 12.0–15.0)
MCH: 31.4 pg (ref 26.0–34.0)
MCHC: 33.5 g/dL (ref 30.0–36.0)
MCV: 93.8 fL (ref 80.0–100.0)
Platelets: 217 10*3/uL (ref 150–400)
RBC: 4.36 MIL/uL (ref 3.87–5.11)
RDW: 13.1 % (ref 11.5–15.5)
WBC: 6.5 10*3/uL (ref 4.0–10.5)
nRBC: 0 % (ref 0.0–0.2)

## 2022-01-27 LAB — LIPASE, BLOOD: Lipase: 38 U/L (ref 11–51)

## 2022-01-27 MED ORDER — IOHEXOL 300 MG/ML  SOLN
100.0000 mL | Freq: Once | INTRAMUSCULAR | Status: AC | PRN
  Administered 2022-01-27: 100 mL via INTRAVENOUS

## 2022-01-27 NOTE — ED Provider Notes (Signed)
[facility] DEPT Provider Note: Lauren Dell, MD, FACEP  CSN: [id number] MRN: [id number] ARRIVAL: 01/26/22 at 2355 ROOM: WA19/WA19   CHIEF COMPLAINT  Abdominal Pain   HISTORY OF PRESENT ILLNESS  01/27/22 3:35 AM Lauren [name] is a 56 y.o. female with right lower quadrant abdominal pain since about 8 PM yesterday evening.  She initially thought it felt like gas but it later became crampy and like childbirth.  She rated it as a 9 out of 10 at its worst, but the pain has subsequently improved and she now rates it a 2 out of 10.  There is still some right lower quadrant tenderness.  She has no associated vomiting or diarrhea.  She has had no fever with this.    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    Cardiomyopathy    post-partum 2002   Dizziness    History of cardiomyopathy    Hyperlipidemia 04/28/2016   Iron deficiency anemia    Menstrual migraine    PAC (premature atrial contraction) 04/28/2016   Pain of cervical spine    Palpitations    Paresthesia    PVC (premature ventricular contraction) 04/28/2016   Right ear pain     Past Surgical History:  Procedure Laterality Date   echocardiogram stress test  12/30/10   TRANSTHORACIC ECHOCARDIOGRAM  03/11/06    Family History  Problem Relation Age of Onset   Cancer Maternal Grandmother        reproductive organs   Cancer Paternal Grandmother    Atrial fibrillation Mother    Heart failure Mother    CAD Mother    Hypertension Father    Atrial fibrillation Father    Peripheral Artery Disease Father    Diabetes Father     Social History   Tobacco Use   Smoking status: Former    Types: Cigarettes    Quit date: 06/02/1989    Years since quitting: 32.6   Smokeless tobacco: Never   Tobacco comments:    started at 89, smoked less than 1 ppd; quit in 1990  Substance Use Topics   Alcohol use: Yes    Alcohol/week: 0.0 standard drinks of alcohol     Comment: approximately 3-5 drinks/week    Drug use: No    Prior to Admission medications   Medication Sig Start Date End Date Taking? Authorizing Provider  Cholecalciferol (VITAMIN D3 PO) Take 5,000 Units by mouth daily.    [provider]  estradiol (VIVELLE-DOT) 0.05 MG/24HR patch 1 patch 2 (two) times a week. 09/10/21   [provider]  estradiol (VIVELLE-DOT) 0.05 MG/24HR patch Place 1 patch (0.05 mg total) onto the skin 2 (two) times a week. 06/20/21     Estradiol-Progesterone (BIJUVA) 1-100 MG CAPS Take 1 capsule by mouth daily. 01/30/21   York Spaniel, MD  Galcanezumab-gnlm [facility]) 120 MG/ML SOAJ Inject 120 mg into the skin every 30 (thirty) days. 10/08/21   Anson Fret, MD  Galcanezumab-gnlm (EMGALITY) 120 MG/ML SOAJ Inject 120 mg into the skin every 30 (thirty) days. 12/04/21   Anson Fret, MD  Galcanezumab-gnlm (EMGALITY) 120 MG/ML SOAJ Inject 120 mg (1 mL) into the skin every 30 (thirty) days. 11/02/21   Anson Fret, MD  L-Theanine 200 MG CAPS Take 200 mg by mouth.    [provider]  metoprolol succinate (TOPROL XL) 25 MG 24 hr tablet Take 1 tablet (25 mg total) by mouth daily.  02/08/21   Chilton Si, MD  Multiple Vitamin (MULTIVITAMIN) capsule Take 1 capsule by mouth daily.    [provider]  Omega-3 1000 MG CAPS Take by mouth daily.    [provider]  progesterone (PROMETRIUM) 100 MG capsule Take 100 mg by mouth at bedtime. 09/15/21   [provider]  progesterone (PROMETRIUM) 100 MG capsule Take 1 capsule (100 mg total) by mouth at bedtime. 06/18/21     rosuvastatin (CRESTOR) 5 MG tablet Take 1 tablet (5 mg total) by mouth daily. 12/20/21     SUMAtriptan (IMITREX) 100 MG tablet Take 1 tablet (100 mg total) by mouth once as needed for up to 1 dose for migraine. May repeat in 2 hours if headache persists or recurs once. 10/08/21   Anson Fret, MD  tiZANidine (ZANAFLEX) 2 MG tablet Take 1 tablet (2 mg total) by  mouth every 8 (eight) hours as needed. 10/08/21   Anson Fret, MD    Allergies Doxycycline   REVIEW OF SYSTEMS  Negative except as noted here or in the History of Present Illness.   PHYSICAL EXAMINATION  Initial Vital Signs Blood pressure 134/78, pulse 72, temperature 98.1 F (36.7 C), temperature source Oral, resp. rate 16, height 5\' 3"  (1.6 m), weight 50.3 kg, last menstrual period 10/13/2013, SpO2 95 %.  Examination General: Well-developed, thin female in no acute distress; appearance consistent with age of record HENT: normocephalic; atraumatic Eyes: Normal appearance Neck: supple Heart: regular rate and rhythm Lungs: clear to auscultation bilaterally Abdomen: soft; nondistended; right lower quadrant tenderness; no masses or hepatosplenomegaly; bowel sounds present Extremities: No deformity; full range of motion; pulses normal Neurologic: Awake, alert and oriented; motor function intact in all extremities and symmetric; no facial droop Skin: Warm and dry Psychiatric: Normal mood and affect   RESULTS  Summary of this visit's results, reviewed and interpreted by myself:   EKG Interpretation  Date/Time:    Ventricular Rate:    PR Interval:    QRS Duration:   QT Interval:    QTC Calculation:   R Axis:     Text Interpretation:         Laboratory Studies: Results for orders placed or performed during the hospital encounter of 01/27/22 (from the past 24 hour(s))  Urinalysis, Routine w reflex microscopic     Status: None   Collection Time: 01/27/22 12:08 AM  Result Value Ref Range   Color, Urine YELLOW YELLOW   APPearance CLEAR CLEAR   Specific Gravity, Urine 1.012 1.005 - 1.030   pH 5.0 5.0 - 8.0   Glucose, UA NEGATIVE NEGATIVE mg/dL   Hgb urine dipstick NEGATIVE NEGATIVE   Bilirubin Urine NEGATIVE NEGATIVE   Ketones, ur NEGATIVE NEGATIVE mg/dL   Protein, ur NEGATIVE NEGATIVE mg/dL   Nitrite NEGATIVE NEGATIVE   Leukocytes,Ua NEGATIVE NEGATIVE   RBC / HPF  0-5 0 - 5 RBC/hpf   WBC, UA 0-5 0 - 5 WBC/hpf   Bacteria, UA NONE SEEN NONE SEEN   Squamous Epithelial / LPF 0-5 0 - 5   Mucus PRESENT   Lipase, blood     Status: None   Collection Time: 01/27/22 12:24 AM  Result Value Ref Range   Lipase 38 11 - 51 U/L  Comprehensive metabolic panel     Status: Abnormal   Collection Time: 01/27/22 12:24 AM  Result Value Ref Range   Sodium 143 135 - 145 mmol/L   Potassium 3.3 (L) 3.5 - 5.1 mmol/L   Chloride  110 98 - 111 mmol/L   CO2 26 22 - 32 mmol/L   Glucose, Bld 112 (H) 70 - 99 mg/dL   BUN 12 6 - 20 mg/dL   Creatinine, Ser 7.04 0.44 - 1.00 mg/dL   Calcium 9.8 8.9 - 88.8 mg/dL   Total Protein 7.0 6.5 - 8.1 g/dL   Albumin 4.2 3.5 - 5.0 g/dL   AST 28 15 - 41 U/L   ALT 25 0 - 44 U/L   Alkaline Phosphatase 47 38 - 126 U/L   Total Bilirubin 0.6 0.3 - 1.2 mg/dL   GFR, Estimated >91 >69 mL/min   Anion gap 7 5 - 15  CBC     Status: None   Collection Time: 01/27/22 12:24 AM  Result Value Ref Range   WBC 6.5 4.0 - 10.5 K/uL   RBC 4.36 3.87 - 5.11 MIL/uL   Hemoglobin 13.7 12.0 - 15.0 g/dL   HCT 45.0 38.8 - 82.8 %   MCV 93.8 80.0 - 100.0 fL   MCH 31.4 26.0 - 34.0 pg   MCHC 33.5 30.0 - 36.0 g/dL   RDW 00.3 49.1 - 79.1 %   Platelets 217 150 - 400 K/uL   nRBC 0.0 0.0 - 0.2 %   Imaging Studies: CT ABDOMEN PELVIS W CONTRAST  Result Date: 01/27/2022 CLINICAL DATA:  Right lower quadrant pain. EXAM: CT ABDOMEN AND PELVIS WITH CONTRAST TECHNIQUE: Multidetector CT imaging of the abdomen and pelvis was performed using the standard protocol following bolus administration of intravenous contrast. RADIATION DOSE REDUCTION: This exam was performed according to the departmental dose-optimization program which includes automated exposure control, adjustment of the mA and/or kV according to patient size and/or use of iterative reconstruction technique. CONTRAST:  OMNIPAQUE IOHEXOL 300 MG/ML  SOLN COMPARISON:  None Available. FINDINGS: Lower chest: No acute  abnormality. Hepatobiliary: No focal liver abnormality is seen. The gallbladder is normal in appearance, without evidence of gallstones or pericholecystic inflammatory fat stranding. The common bile duct measures 8.9 mm in diameter. Pancreas: The head of the pancreas is enlarged and measures approximately 4.1 cm x 3.0 cm x 4.3 cm. No distinct pancreatic mass is identified. There is no evidence of peripancreatic inflammatory fat stranding or pancreatic ductal dilatation. Spleen: Normal in size without focal abnormality. Adrenals/Urinary Tract: Adrenal glands are unremarkable. Kidneys are normal in size, without renal calculi or hydronephrosis. A 9 mm diameter simple cyst is seen within the anterior aspect of the mid to upper right kidney. An 8 mm simple cyst is seen within the posterolateral aspect of the mid left kidney. The urinary bladder is poorly distended and subsequently limited in evaluation. Stomach/Bowel: There is thickening of the gastric fundus and adjacent portion of the body of the stomach which is also poorly distended. The appendix is not clearly identified. Stool is seen throughout the large bowel. Numerous adjacent loops of partially decompressed small bowel is seen within the lower abdomen and pelvis. These bowel loops are subsequently limited in evaluation. No evidence of bowel dilatation. Vascular/Lymphatic: There is tortuosity of the abdominal aorta, without evidence of aortic atherosclerosis or aortic aneurysm. No enlarged abdominal or pelvic lymph nodes. Reproductive: Numerous tortuous vessels are seen along the periphery of a normal appearing uterus. The bilateral adnexa are unremarkable. Other: No abdominal wall hernia or abnormality. No abdominopelvic ascites. Musculoskeletal: No acute or significant osseous findings. IMPRESSION: 1. Nonvisualization of the appendix, without secondary signs of appendicitis. 2. Enlarged head of the pancreas without visualization of a distinct pancreatic  mass.  MRI correlation is recommended. 3. Numerous tortuous vessels along the periphery of a normal appearing uterus. This finding is nonspecific, but can be seen in the setting of pelvic congestion syndrome. 4. Small bilateral simple renal cysts (Bosniak 1). No additional follow-up imaging is recommended. This recommendation follows ACR consensus guidelines: Management of the Incidental Renal Mass on CT: A White Paper of the ACR Incidental Findings Committee. J Am Coll Radiol [phone]. 5. Thickening of the gastric fundus and gastric body which may be secondary to poor gastric distension. Aortic Atherosclerosis (ICD10-I70.0). Electronically Signed   By: Aram Candela M.D.   On: 01/27/2022 02:54    ED COURSE and MDM  Nursing notes, initial and subsequent vitals signs, including pulse oximetry, reviewed and interpreted by myself.  Vitals:   01/27/22 0006 01/27/22 0335 01/27/22 0336 01/27/22 0337  BP:  118/77  118/77  Pulse:   (!) 59 60  Resp:    16  Temp:      TempSrc:      SpO2:   100% 100%  Weight: 50.3 kg     Height: 5\' 3"  (1.6 m)      Medications  iohexol (OMNIPAQUE) 300 MG/ML solution 100 mL (100 mLs Intravenous Contrast Given 01/27/22 0225)   The patient's laboratory studies are reassuring.  The patient was advised of the CT findings.  There is no definitive evidence of acute appendicitis and her symptoms are improving but she was advised to return if symptoms worsen and we would reevaluate and possibly rescan her as she could have a simmering appendicitis at its early stage.  She was advised of the pancreatic findings as well and should follow-up with her PCP regarding this.  She was advised of the pelvic findings and well and should follow-up with her OB/GYN regarding that.   PROCEDURES  Procedures   ED DIAGNOSES     ICD-10-CM   1. Right lower quadrant abdominal pain  R10.31          Rihana Kiddy, 01/29/22, MD 01/27/22 [phone]

## 2022-01-27 NOTE — ED Triage Notes (Signed)
Patient said she began having right lower quadrant abdominal pain around 8pm. She thought it was gas but it never went away. Feeling like cramps and child birth. No vomiting or diarrhea or fever.

## 2022-01-27 NOTE — ED Notes (Signed)
Pt transported to CT ?

## 2022-01-31 ENCOUNTER — Other Ambulatory Visit (HOSPITAL_BASED_OUTPATIENT_CLINIC_OR_DEPARTMENT_OTHER): Payer: Self-pay | Admitting: Cardiovascular Disease

## 2022-01-31 NOTE — Telephone Encounter (Signed)
Rx request sent to pharmacy.  

## 2022-02-10 ENCOUNTER — Other Ambulatory Visit (HOSPITAL_COMMUNITY): Payer: Self-pay

## 2022-02-16 ENCOUNTER — Other Ambulatory Visit (HOSPITAL_COMMUNITY): Payer: Self-pay

## 2022-02-17 ENCOUNTER — Other Ambulatory Visit (HOSPITAL_COMMUNITY): Payer: Self-pay

## 2022-02-17 NOTE — Telephone Encounter (Signed)
Received [employer] [place] on Cover My Meds. This has been completed and sent to plan. Key: [id number]. Awaiting determination from [place].

## 2022-02-18 ENCOUNTER — Other Ambulatory Visit (HOSPITAL_COMMUNITY): Payer: Self-pay

## 2022-02-18 NOTE — Telephone Encounter (Signed)
The request has been approved. The authorization is effective for a maximum of 12 fills from 02/17/2022 to 02/17/2023, as long as the member is enrolled in their current health plan. The request was approved with a quantity restriction. This has been approved for a max daily dosage of 0.03. A written notification letter will follow with additional details.

## 2022-02-19 ENCOUNTER — Other Ambulatory Visit (HOSPITAL_COMMUNITY): Payer: Self-pay

## 2022-02-20 ENCOUNTER — Other Ambulatory Visit (HOSPITAL_COMMUNITY): Payer: Self-pay

## 2022-02-25 ENCOUNTER — Other Ambulatory Visit (HOSPITAL_COMMUNITY): Payer: Self-pay

## 2022-03-03 ENCOUNTER — Other Ambulatory Visit: Payer: Self-pay | Admitting: Family Medicine

## 2022-03-03 DIAGNOSIS — K8689 Other specified diseases of pancreas: Secondary | ICD-10-CM

## 2022-03-10 ENCOUNTER — Other Ambulatory Visit (HOSPITAL_COMMUNITY): Payer: Self-pay | Admitting: Family Medicine

## 2022-03-10 DIAGNOSIS — K8689 Other specified diseases of pancreas: Secondary | ICD-10-CM

## 2022-03-14 ENCOUNTER — Other Ambulatory Visit (HOSPITAL_COMMUNITY): Payer: Self-pay

## 2022-03-20 ENCOUNTER — Other Ambulatory Visit (HOSPITAL_COMMUNITY): Payer: Self-pay

## 2022-03-21 ENCOUNTER — Ambulatory Visit (HOSPITAL_COMMUNITY)
Admission: RE | Admit: 2022-03-21 | Discharge: 2022-03-21 | Disposition: A | Discharge status: Home / Self Care | Payer: 59 | Source: Ambulatory Visit | Attending: Family Medicine | Admitting: Family Medicine

## 2022-03-21 ENCOUNTER — Other Ambulatory Visit (HOSPITAL_COMMUNITY): Payer: Self-pay | Admitting: Family Medicine

## 2022-03-21 ENCOUNTER — Other Ambulatory Visit (HOSPITAL_COMMUNITY): Payer: Self-pay

## 2022-03-21 DIAGNOSIS — K8689 Other specified diseases of pancreas: Secondary | ICD-10-CM

## 2022-03-21 MED ORDER — GADOBUTROL 1 MMOL/ML IV SOLN
5.0000 mL | Freq: Once | INTRAVENOUS | Status: AC | PRN
  Administered 2022-03-21: 5 mL via INTRAVENOUS

## 2022-03-21 MED FILL — Metoprolol Succinate Tab ER 24HR 25 MG (Tartrate Equiv): ORAL | 90 days supply | Qty: 90 | Fill #0 | Status: CN

## 2022-03-22 ENCOUNTER — Other Ambulatory Visit (HOSPITAL_COMMUNITY): Payer: Self-pay

## 2022-03-24 ENCOUNTER — Other Ambulatory Visit: Payer: Self-pay | Admitting: Obstetrics and Gynecology

## 2022-03-24 ENCOUNTER — Other Ambulatory Visit (HOSPITAL_COMMUNITY): Payer: Self-pay

## 2022-03-24 DIAGNOSIS — R928 Other abnormal and inconclusive findings on diagnostic imaging of breast: Secondary | ICD-10-CM

## 2022-03-24 MED FILL — Metoprolol Succinate Tab ER 24HR 25 MG (Tartrate Equiv): ORAL | 90 days supply | Qty: 90 | Fill #0 | Status: CN

## 2022-03-24 MED FILL — Metoprolol Succinate Tab ER 24HR 25 MG (Tartrate Equiv): ORAL | 90 days supply | Qty: 90 | Fill #0 | Status: AC

## 2022-04-03 ENCOUNTER — Other Ambulatory Visit: Payer: 59

## 2022-04-15 ENCOUNTER — Ambulatory Visit
Admission: RE | Admit: 2022-04-15 | Discharge: 2022-04-15 | Disposition: A | Discharge status: Home / Self Care | Payer: 59 | Source: Ambulatory Visit | Attending: Obstetrics and Gynecology | Admitting: Obstetrics and Gynecology

## 2022-04-15 ENCOUNTER — Ambulatory Visit: Payer: 59

## 2022-04-15 DIAGNOSIS — R928 Other abnormal and inconclusive findings on diagnostic imaging of breast: Secondary | ICD-10-CM

## 2022-04-18 ENCOUNTER — Other Ambulatory Visit (HOSPITAL_COMMUNITY): Payer: Self-pay

## 2022-05-08 ENCOUNTER — Other Ambulatory Visit (HOSPITAL_COMMUNITY): Payer: Self-pay

## 2022-05-08 MED ORDER — ESTRADIOL 0.05 MG/24HR TD PTTW
1.0000 | MEDICATED_PATCH | TRANSDERMAL | 3 refills | Status: DC
  Filled 2022-05-18: qty 24, 84d supply, fill #0
  Filled 2022-07-30: qty 24, 84d supply, fill #1
  Filled 2022-10-13 – 2022-10-24 (×3): qty 24, 84d supply, fill #2
  Filled 2022-12-22 – 2023-01-19 (×3): qty 24, 84d supply, fill #3

## 2022-05-13 ENCOUNTER — Other Ambulatory Visit (HOSPITAL_COMMUNITY): Payer: Self-pay

## 2022-05-13 MED ORDER — METRONIDAZOLE 0.75 % VA GEL
VAGINAL | 0 refills | Status: DC
  Filled 2022-05-13: qty 70, 5d supply, fill #0

## 2022-05-14 ENCOUNTER — Other Ambulatory Visit (HOSPITAL_COMMUNITY): Payer: Self-pay

## 2022-05-16 IMAGING — CT CT CARDIAC CORONARY ARTERY CALCIUM SCORE
3 of 4 series · 13 of 20 positions shown, 15 images · non-contrast
Comparison: Chest radiograph 03/22/2019

Addendum:
CLINICAL DATA: Risk stratification: 55 Year-old White Female

EXAM:
Coronary Calcium Score
TECHNIQUE: The patient was scanned on a Siemens Force scanner. Axial
non-contrast 3 mm slices were carried out through the heart. The
data set was analyzed on a dedicated work station and scored using
the Agatson method.

[Series 2: ax lung · axial · 0.67mm/px · z∈[-177,-65]mm · 5 of 86 slices shown]
[im 15/86  lung]
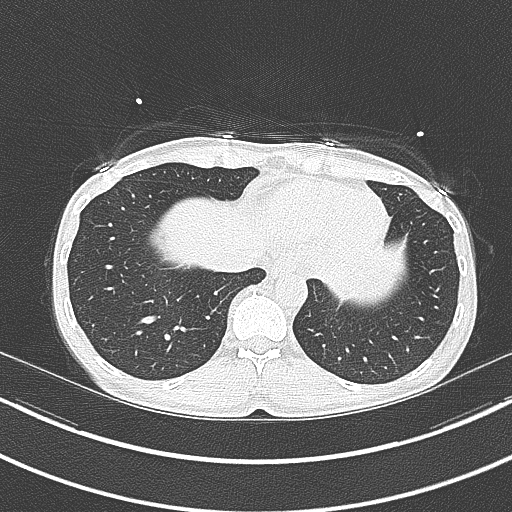
[im 29/86  lung]
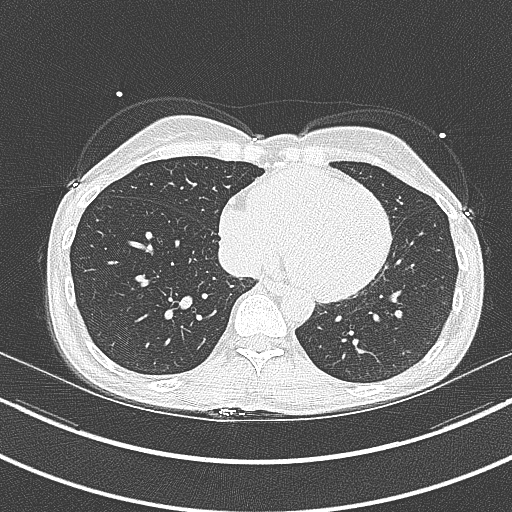
[im 43/86  lung]
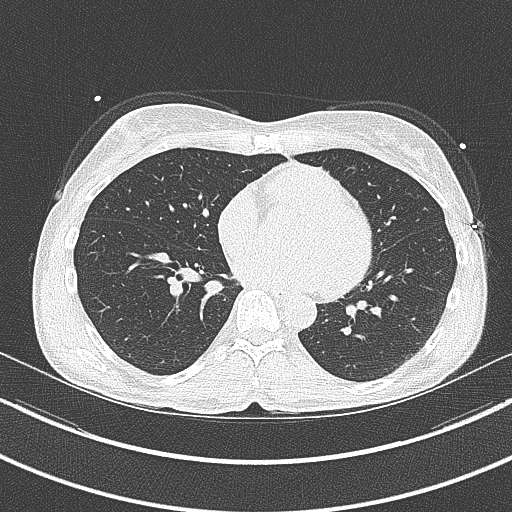
[im 57/86  lung]
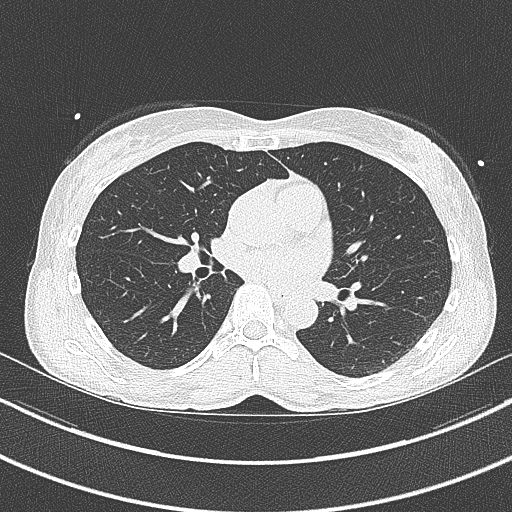
[im 71/86  lung]
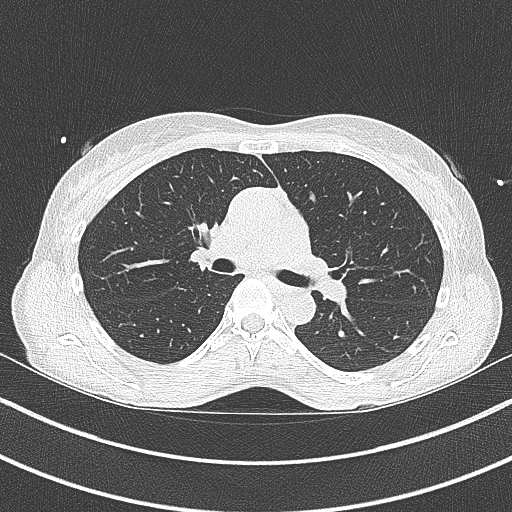

[Series 3: cascseq 3.0 sa36 70% (id) · axial · 0.30mm/px · z∈[-162,-78]mm · 3 of 57 slices shown]
[im 15/57  vessel]
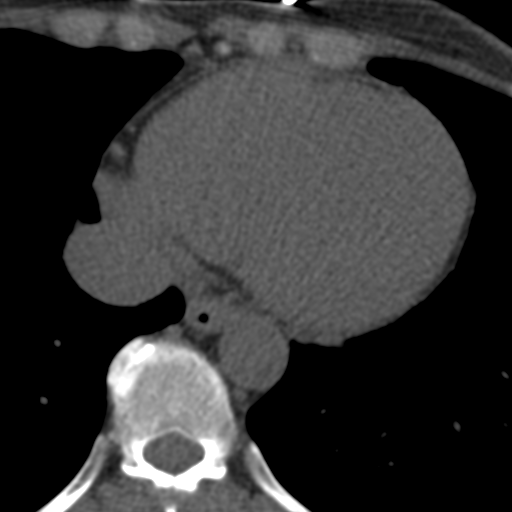
[im 29/57  vessel]
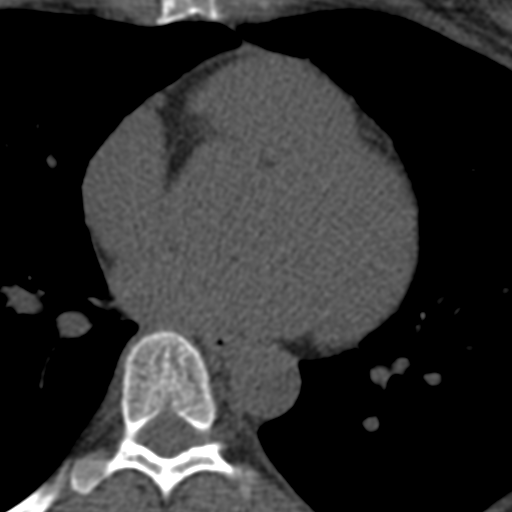
[im 43/57  vessel]
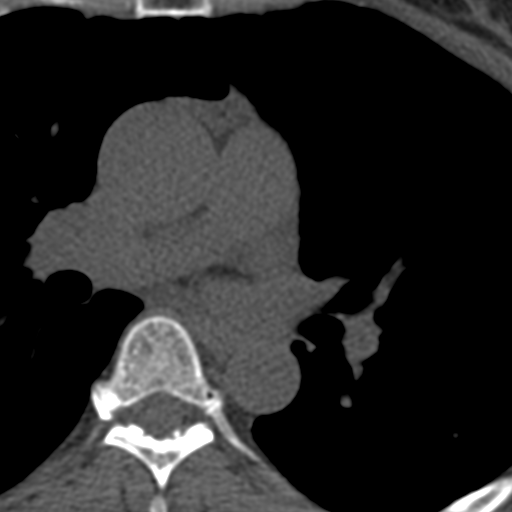

[Series 4: ax st · axial · 0.62mm/px · z∈[-177,-65]mm · 5 of 86 slices shown, 7 images]
[im 15/86  vessel]
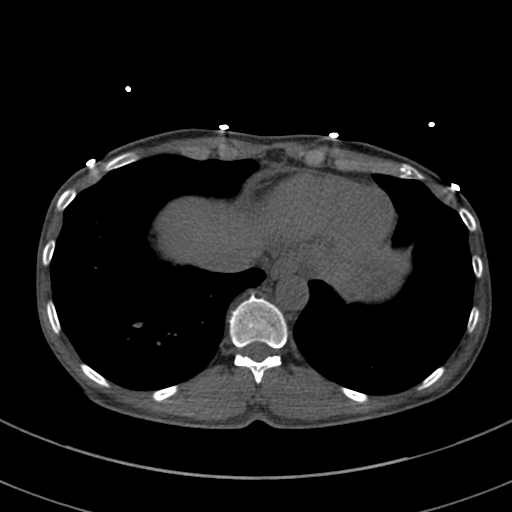
[im 15/86  lung]
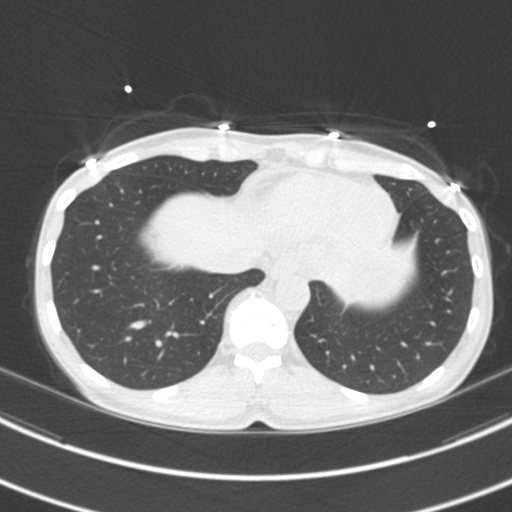
[im 29/86  vessel]
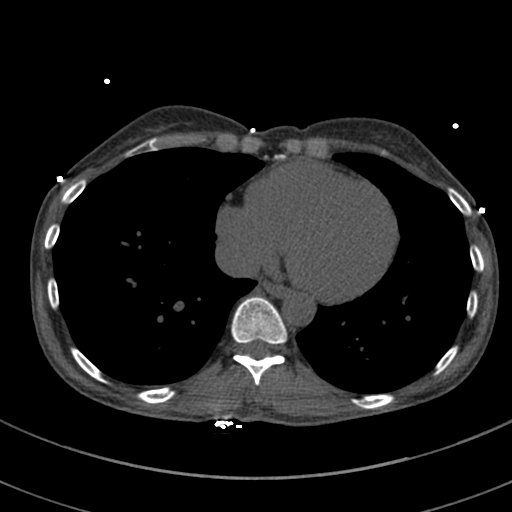
[im 43/86  vessel]
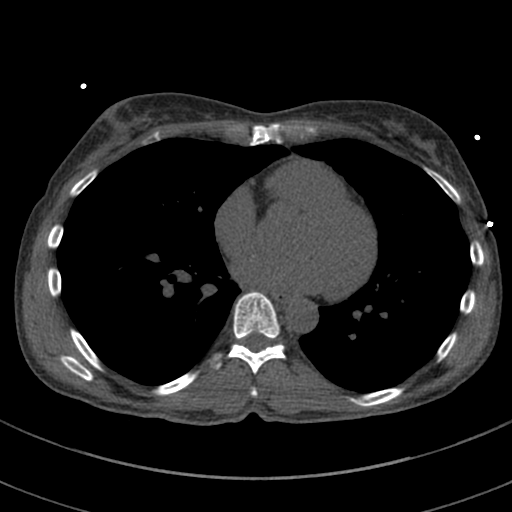
[im 57/86  vessel]
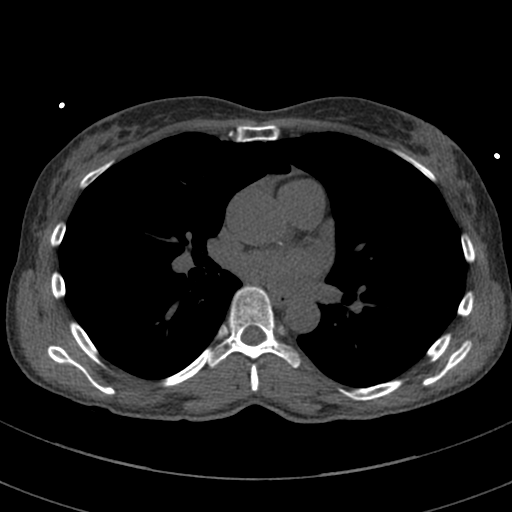
[im 71/86  vessel]
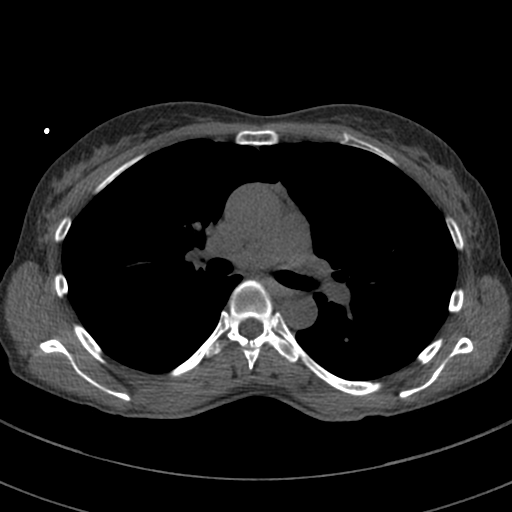
[im 71/86  lung]
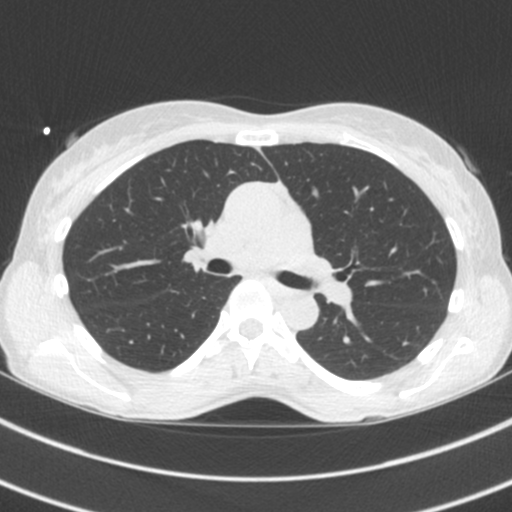

[13 of 20 positions shown; findings below may reference images not displayed]

FINDINGS: Non-cardiac: See separate report from [REDACTED].

Ascending Aorta: Normal caliber.  Aortic atherosclerosis.

Pericardium: Normal.

Coronary arteries: Normal origins.

Coronary Calcium Score:

Left main: 7

Left anterior descending artery: 0

Left circumflex artery: 0

Right coronary artery: 0

Total: 7

Percentile: 80th for age, sex, and race matched control.
IMPRESSION: 1. Coronary calcium score of 7. This was 80th percentile for age,
gender, and race matched controls.

2.  Aortic atherosclerosis.

RECOMMENDATIONS:



If CAC = 0, it is reasonable to withhold statin therapy and reassess
in 5 to 10 years, as long as higher risk conditions are absent
(diabetes mellitus, family history of premature CHD in first degree
relatives (males <55 years; females <65 years), cigarette smoking,
LDL >=190 mg/dL or other independent risk factors).

If CAC is 1 to 99, it is reasonable to initiate statin therapy for
patients >=55 years of age.

If CAC is >=100 or >=75th percentile, it is reasonable to initiate
statin therapy at any age.

Cardiology referral should be considered for patients with CAC
scores =400 or >=75th percentile.

*1378 AHA/ACC/AACVPR/AAPA/ABC/JOCELYNE/CEOLA/BENAVIDEZ/Engr Joseph/BUNTAL/MMANNANA/WETZEL
Guideline on the Management of Blood Cholesterol: A Report of the
American College of Cardiology/American Heart Association Task Force
on Clinical Practice Guidelines. J Am Coll Cardiol.
8651;73(24):5205-5769.

EXAM:
OVER-READ INTERPRETATION  CT CHEST

The following report is an over-read performed by radiologist Dr.
does not include interpretation of cardiac or coronary anatomy or
pathology. The coronary calcium score interpretation by the
cardiologist is attached.
FINDINGS: Normal caliber of the visualized thoracic aorta. Visualized
mediastinal structures are normal. Images of the upper abdomen are
unremarkable. No airspace disease or consolidation in the visualized
lungs. Small nodular density near the left major fissure on sequence
2 image 33 measures 2 mm. Additional 3 mm nodule along the left
major fissure on image 28. Additional tiny subpleural nodule in the
left lower lobe on image 86 that measures 2 mm. No acute bone
abnormality.
IMPRESSION: 1. No acute extracardiac findings.
2. Tiny nodular densities in the left lung, largest measures 3 mm.
No follow-up needed if patient is low-risk. Non-contrast chest CT
can be considered in 12 months if patient is high-risk. This
recommendation follows the consensus statement: Guidelines for
Management of Incidental Pulmonary Nodules Detected on CT Images:

*** End of Addendum ***
FINDINGS: Non-cardiac: See separate report from [REDACTED].

Ascending Aorta: Normal caliber.  Aortic atherosclerosis.

Pericardium: Normal.

Coronary arteries: Normal origins.

Coronary Calcium Score:

Left main: 7

Left anterior descending artery: 0

Left circumflex artery: 0

Right coronary artery: 0

Total: 7

Percentile: 80th for age, sex, and race matched control.
IMPRESSION: 1. Coronary calcium score of 7. This was 80th percentile for age,
gender, and race matched controls.

2.  Aortic atherosclerosis.

RECOMMENDATIONS:



If CAC = 0, it is reasonable to withhold statin therapy and reassess
in 5 to 10 years, as long as higher risk conditions are absent
(diabetes mellitus, family history of premature CHD in first degree
relatives (males <55 years; females <65 years), cigarette smoking,
LDL >=190 mg/dL or other independent risk factors).

If CAC is 1 to 99, it is reasonable to initiate statin therapy for
patients >=55 years of age.

If CAC is >=100 or >=75th percentile, it is reasonable to initiate
statin therapy at any age.

Cardiology referral should be considered for patients with CAC
scores =400 or >=75th percentile.

*1378 AHA/ACC/AACVPR/AAPA/ABC/JOCELYNE/CEOLA/BENAVIDEZ/Engr Joseph/BUNTAL/MMANNANA/WETZEL
Guideline on the Management of Blood Cholesterol: A Report of the
American College of Cardiology/American Heart Association Task Force
on Clinical Practice Guidelines. J Am Coll Cardiol.
8651;73(24):5205-5769.

## 2022-05-19 ENCOUNTER — Other Ambulatory Visit: Payer: Self-pay

## 2022-06-01 ENCOUNTER — Other Ambulatory Visit (HOSPITAL_COMMUNITY): Payer: Self-pay

## 2022-06-02 ENCOUNTER — Other Ambulatory Visit (HOSPITAL_COMMUNITY): Payer: Self-pay

## 2022-06-13 ENCOUNTER — Other Ambulatory Visit (HOSPITAL_COMMUNITY): Payer: Self-pay

## 2022-06-19 ENCOUNTER — Other Ambulatory Visit: Payer: Self-pay

## 2022-06-19 ENCOUNTER — Other Ambulatory Visit (HOSPITAL_COMMUNITY): Payer: Self-pay

## 2022-06-19 MED ORDER — PROGESTERONE MICRONIZED 100 MG PO CAPS
ORAL_CAPSULE | ORAL | 2 refills | Status: DC
  Filled 2022-06-19: qty 90, 90d supply, fill #0
  Filled 2022-09-16: qty 90, 90d supply, fill #1
  Filled 2022-12-22: qty 90, 90d supply, fill #2

## 2022-06-19 MED FILL — Metoprolol Succinate Tab ER 24HR 25 MG (Tartrate Equiv): ORAL | 60 days supply | Qty: 60 | Fill #1 | Status: AC

## 2022-06-23 ENCOUNTER — Other Ambulatory Visit (HOSPITAL_BASED_OUTPATIENT_CLINIC_OR_DEPARTMENT_OTHER): Payer: Self-pay | Admitting: Cardiovascular Disease

## 2022-06-23 ENCOUNTER — Other Ambulatory Visit (HOSPITAL_COMMUNITY): Payer: Self-pay

## 2022-06-23 MED ORDER — METOPROLOL SUCCINATE ER 25 MG PO TB24
25.0000 mg | ORAL_TABLET | Freq: Every day | ORAL | 0 refills | Status: DC
  Filled 2022-06-23 – 2022-06-27 (×4): qty 90, 90d supply, fill #0

## 2022-06-23 NOTE — Telephone Encounter (Signed)
Rx request sent to pharmacy.  

## 2022-06-24 ENCOUNTER — Other Ambulatory Visit (HOSPITAL_COMMUNITY): Payer: Self-pay

## 2022-06-27 ENCOUNTER — Other Ambulatory Visit (HOSPITAL_COMMUNITY): Payer: Self-pay

## 2022-07-15 ENCOUNTER — Other Ambulatory Visit (HOSPITAL_COMMUNITY): Payer: Self-pay

## 2022-07-30 ENCOUNTER — Other Ambulatory Visit (HOSPITAL_COMMUNITY): Payer: Self-pay

## 2022-07-31 ENCOUNTER — Other Ambulatory Visit (HOSPITAL_COMMUNITY): Payer: Self-pay

## 2022-08-25 ENCOUNTER — Other Ambulatory Visit (HOSPITAL_COMMUNITY): Payer: Self-pay

## 2022-08-25 DIAGNOSIS — N760 Acute vaginitis: Secondary | ICD-10-CM | POA: Diagnosis not present

## 2022-08-25 DIAGNOSIS — R8279 Other abnormal findings on microbiological examination of urine: Secondary | ICD-10-CM | POA: Diagnosis not present

## 2022-08-25 MED ORDER — METRONIDAZOLE 0.75 % VA GEL
VAGINAL | 0 refills | Status: DC
  Filled 2022-08-25: qty 70, 5d supply, fill #0

## 2022-08-27 ENCOUNTER — Other Ambulatory Visit (HOSPITAL_COMMUNITY): Payer: Self-pay

## 2022-08-27 MED ORDER — SULFAMETHOXAZOLE-TRIMETHOPRIM 800-160 MG PO TABS
ORAL_TABLET | ORAL | 0 refills | Status: DC
  Filled 2022-08-27 (×2): qty 10, 5d supply, fill #0

## 2022-08-28 ENCOUNTER — Other Ambulatory Visit (HOSPITAL_COMMUNITY): Payer: Self-pay

## 2022-09-01 ENCOUNTER — Other Ambulatory Visit (HOSPITAL_COMMUNITY): Payer: Self-pay

## 2022-09-01 MED ORDER — FLUCONAZOLE 150 MG PO TABS
150.0000 mg | ORAL_TABLET | ORAL | 0 refills | Status: AC
  Filled 2022-09-01: qty 1, 1d supply, fill #0

## 2022-09-04 DIAGNOSIS — N841 Polyp of cervix uteri: Secondary | ICD-10-CM | POA: Diagnosis not present

## 2022-09-04 DIAGNOSIS — N840 Polyp of corpus uteri: Secondary | ICD-10-CM | POA: Diagnosis not present

## 2022-09-16 ENCOUNTER — Encounter (INDEPENDENT_AMBULATORY_CARE_PROVIDER_SITE_OTHER): Payer: 59 | Admitting: Neurology

## 2022-09-16 ENCOUNTER — Other Ambulatory Visit (HOSPITAL_BASED_OUTPATIENT_CLINIC_OR_DEPARTMENT_OTHER): Payer: Self-pay | Admitting: Cardiovascular Disease

## 2022-09-16 ENCOUNTER — Other Ambulatory Visit (HOSPITAL_COMMUNITY): Payer: Self-pay

## 2022-09-16 ENCOUNTER — Other Ambulatory Visit: Payer: Self-pay

## 2022-09-16 DIAGNOSIS — G43709 Chronic migraine without aura, not intractable, without status migrainosus: Secondary | ICD-10-CM

## 2022-09-16 MED ORDER — PREDNISONE 5 MG PO TABS: ORAL_TABLET | ORAL | 0 refills | Status: DC

## 2022-09-16 NOTE — Telephone Encounter (Signed)
  Thank you for your message seeking medical advice. My assessment and recommendation are as follows: Prednisone 5 mg taper for prolonged migraine   Sincerely,  Glean Salvo, NP    *This exchange required the expertise of a doctor, nurse practitioner, physician assistant, optometrist or certified nurse midwife and qualifies as a Medical Advice Message, please visit [email or web address] for more details. [facility] will bill your insurance on your behalf; copays and deductibles may apply. Questions? Reply to this message.   I spent 5 minutes on this my chart encounter.

## 2022-09-16 NOTE — Telephone Encounter (Signed)
Please call pt to schedule overdue follow-up appointment with Dr. Duke Salvia or APP for refills. Last OV 06/2021--was supposed to follow-up 6-8 weeks after this. Thank you!

## 2022-09-18 NOTE — Telephone Encounter (Signed)
Left message for patient to call and schedule her overdue follow up with Dr. Duke Salvia / APP for medication refills

## 2022-09-24 ENCOUNTER — Other Ambulatory Visit (HOSPITAL_COMMUNITY): Payer: Self-pay

## 2022-09-24 ENCOUNTER — Telehealth (HOSPITAL_BASED_OUTPATIENT_CLINIC_OR_DEPARTMENT_OTHER): Payer: Self-pay | Admitting: Cardiovascular Disease

## 2022-09-24 MED ORDER — METOPROLOL SUCCINATE ER 25 MG PO TB24
25.0000 mg | ORAL_TABLET | Freq: Every day | ORAL | 0 refills | Status: DC
  Filled 2022-09-24: qty 90, 90d supply, fill #0

## 2022-09-24 NOTE — Telephone Encounter (Signed)
*  STAT* If patient is at the pharmacy, call can be transferred to refill team.   1. Which medications need to be refilled? (please list name of each medication and dose if known)   metoprolol succinate (TOPROL-XL) 25 MG 24 hr tablet    2. Which pharmacy/location (including street and city if local pharmacy) is medication to be sent to? [place] [facility]   3. Do they need a 30 day or 90 day supply? 90

## 2022-09-24 NOTE — Telephone Encounter (Signed)
Left message for patient to call and schedule overdue follow up  with Dr. [place] / APP for medication refills 

## 2022-09-24 NOTE — Telephone Encounter (Signed)
Patient as appt on 6/11. Please see below

## 2022-09-24 NOTE — Telephone Encounter (Signed)
Rx(s) sent to pharmacy electronically.  

## 2022-09-26 NOTE — Telephone Encounter (Signed)
Scheduled 11/11/22 with Gillian Shields, NP

## 2022-10-13 ENCOUNTER — Other Ambulatory Visit (HOSPITAL_COMMUNITY): Payer: Self-pay

## 2022-10-14 ENCOUNTER — Ambulatory Visit: Payer: 59 | Admitting: Neurology

## 2022-10-14 ENCOUNTER — Encounter: Payer: Self-pay | Admitting: Neurology

## 2022-10-14 ENCOUNTER — Other Ambulatory Visit (HOSPITAL_COMMUNITY): Payer: Self-pay

## 2022-10-14 VITALS — BP 114/69 | HR 71 | Ht 64.0 in | Wt 105.5 lb

## 2022-10-14 DIAGNOSIS — R209 Unspecified disturbances of skin sensation: Secondary | ICD-10-CM | POA: Diagnosis not present

## 2022-10-14 DIAGNOSIS — G43709 Chronic migraine without aura, not intractable, without status migrainosus: Secondary | ICD-10-CM | POA: Diagnosis not present

## 2022-10-14 MED ORDER — SUMATRIPTAN SUCCINATE 100 MG PO TABS
100.0000 mg | ORAL_TABLET | Freq: Once | ORAL | 11 refills | Status: DC | PRN
  Filled 2022-10-14: qty 10, 34d supply, fill #0
  Filled 2022-10-24: qty 9, 30d supply, fill #0
  Filled 2022-11-24: qty 9, 30d supply, fill #1
  Filled 2022-12-22: qty 9, 30d supply, fill #2
  Filled 2023-01-19: qty 9, 30d supply, fill #3
  Filled 2023-03-12: qty 9, 30d supply, fill #4
  Filled 2023-04-09: qty 9, 30d supply, fill #5
  Filled 2023-05-18: qty 9, 30d supply, fill #6
  Filled 2023-06-08 – 2023-06-11 (×2): qty 9, 30d supply, fill #7
  Filled 2023-07-13: qty 9, 30d supply, fill #8
  Filled 2023-08-10 – 2023-08-21 (×2): qty 9, 30d supply, fill #9
  Filled 2023-09-25: qty 9, 30d supply, fill #10

## 2022-10-14 MED ORDER — EMGALITY 120 MG/ML ~~LOC~~ SOAJ
120.0000 mg | SUBCUTANEOUS | 11 refills | Status: DC
  Filled 2022-10-14 – 2022-10-24 (×2): qty 1, 30d supply, fill #0
  Filled 2022-11-22: qty 1, 30d supply, fill #1
  Filled 2022-12-22: qty 1, 30d supply, fill #2
  Filled 2023-01-19: qty 1, 30d supply, fill #3
  Filled 2023-03-12: qty 1, 30d supply, fill #4
  Filled 2023-04-09: qty 1, 30d supply, fill #5
  Filled 2023-05-18: qty 1, 30d supply, fill #6

## 2022-10-14 MED ORDER — TIZANIDINE HCL 2 MG PO TABS
2.0000 mg | ORAL_TABLET | Freq: Three times a day (TID) | ORAL | 5 refills | Status: DC | PRN
  Filled 2022-10-14 – 2022-11-24 (×2): qty 30, 10d supply, fill #0
  Filled 2023-05-18: qty 30, 10d supply, fill #1
  Filled 2023-09-25: qty 30, 10d supply, fill #2

## 2022-10-14 NOTE — Progress Notes (Signed)
Patient: Lauren [name] Date of Birth: December 29, 1965  Reason for Visit: Follow up History from: Patient Primary Neurologist: Lucia Gaskins   ASSESSMENT AND PLAN 57 y.o. year old female   1.  Chronic migraine headaches -Mostly under good control -Continue Emgality for migraine preventative -Continue Imitrex as needed, may combine with tizanidine  2.  Paresthesia, neck pain -[facility], decided to hold off on PT, uses tizanidine for neck tension as needed -Monitor thumb symptoms, intermittent for few years, may consider seeing hand specialist if needed, potentially from overuse? -Continue close follow-up with PCP, monitor weight, follow-up with [place] in 1 year or sooner if needed  HISTORY OF PRESENT ILLNESS: Today 10/14/22 When she last saw Dr. Lucia Gaskins June 2023 complaining of acute neck pain and vision changes.  MRI cervical spine showed prominent spondylitic change at C5-6 with right paracentral disc osteophyte protrusion mild foraminal narrowing on the right.  MRI of the brain with and without contrast was unremarkable.  [facility] referred to [facility].  Sent MyChart message to me 09/16/22 about continuous migraine, 6-day prednisone taper pack was sent in.   Is working as a Engineer, civil (consulting) at [employer]. She went to [facility]. They wanted to refer her to PT, wasn't sure she would benefit so she held off.   For most part her migraines are controlled. Still on Emgality, this past month was bad, 10 migraines a month. Normally, will have 4-5 a month, usually will take Imitrex. Often triggered by neck tension on the left, headaches are left sided. Headaches resolve quickly with Imitrex.   Mentions has noticed with her thumbs intermittent twitching with use, sometimes feel weak, symptoms come and go.  Has been going on for several years.  She does have to pop almost 100 pills when she works as a Engineer, civil (consulting), contributing?  Over the years has had various twitching to  different parts of her body, discussed with Dr. Anne Hahn.  In the last year, has lost around 10 pounds, unclear why? None in the last 6 months.  Has PCP.  HISTORY  Dr. Lucia Gaskins 11/21/2021: She has neck pain, she woke up, she has numbness in the fingers, she woke up her inky was burning and her left eye was crossed, she thought she was having a strossed, she couldn't focus with the left eye, that has happened 2x a week, only when she wakes up, can last 5 - 10 seconds, some morenings is fine, this morning wasn;t "crossed" but it wouldn't open the left eye, 2 weeks ago the left side of the face went numb, numbness in the hands, a round of prednisone did not help, now gradually she has muffled hearing in the left ear, feels like she has cotton in the left ear. She has chronic neck pain, she has numbness in her right hand off and on and numbness in digits 4-5, she has neck pain with radiation into the left arm and numbness in digits 4-5. Completed 3 months of physical therapy on the neck, has been under the care of physicians since 2014 for her neck, tried PT, muscle relaxers, heat, conservative measures, analgesics. Left pulsatile tinnitus, went to the ENT   TM with possibly a little layering in the lower part maybe some fluid  Dr. Lucia Gaskins Interval history 10/08/2021:  This is a former Dr. Anne Hahn patient with migraines transitioning to me. She was started on Emgality but unclear why she has not been able  to get it. Emgality worked Firefighter. She has neck spasms, in the past tizanidine and a medrol dosepak has helped saw Margie Ege NP in the past, also has occipital headaches/neuralgia. Prior to [facility] she had daily headaches and at least 15 migraine days a month that were moderate to severe. Emgality brought her down > 50% and was only taking 2-4 days of imitrex a month and the migraines she did have were less severe. Imitrex significantly helps acutely.  Aimovig is contraindicated due to cardiovascular risks in this  patient but if can't get the emgality would switch to Ajovy with the copay card.   REVIEW OF SYSTEMS: Out of a complete 14 system review of symptoms, the patient complains only of the following symptoms, and all other reviewed systems are negative.  See HPI  ALLERGIES: Allergies  Allergen Reactions   Doxycycline Rash and Other (See Comments)    Other Reaction(s): Not available  REACTION: rash  REACTION: rash  REACTION: rash  REACTION: rash  REACTION: rash  REACTION: rash  REACTION: rash  REACTION: rash    HOME MEDICATIONS: Outpatient Medications Prior to Visit  Medication Sig Dispense Refill   Cholecalciferol (VITAMIN D3 PO) Take 5,000 Units by mouth daily.     estradiol (VIVELLE-DOT) 0.05 MG/24HR patch Place 1 patch (0.05 mg total) onto the skin 2 (two) times a week. 24 patch 3   L-Theanine 200 MG CAPS Take 200 mg by mouth.     metoprolol succinate (TOPROL-XL) 25 MG 24 hr tablet Take 1 tablet (25 mg total) by mouth daily. Keep appointment in June for further refills 90 tablet 0   Multiple Vitamin (MULTIVITAMIN) capsule Take 1 capsule by mouth daily.     Omega-3 1000 MG CAPS Take by mouth daily.     progesterone (PROMETRIUM) 100 MG capsule Take 1 capsule by mouth at bedtime 90 capsule 2   rosuvastatin (CRESTOR) 5 MG tablet Take 1 tablet (5 mg total) by mouth daily. 30 tablet 11   Galcanezumab-gnlm (EMGALITY) 120 MG/ML SOAJ Inject 120 mg (1 mL) into the skin every 30 (thirty) days. 1 mL 11   SUMAtriptan (IMITREX) 100 MG tablet Take 1 tablet (100 mg total) by mouth once for up to 1 dose as needed for migraine. May repeat in 2 hours if headache persists or recurs once. 10 tablet 11   tiZANidine (ZANAFLEX) 2 MG tablet Take 1 tablet (2 mg total) by mouth every 8 (eight) hours as needed. 30 tablet 11   estradiol (VIVELLE-DOT) 0.05 MG/24HR patch 1 patch 2 (two) times a week.     Estradiol-Progesterone (BIJUVA) 1-100 MG CAPS Take 1 capsule by mouth daily. 30 capsule     Galcanezumab-gnlm (EMGALITY) 120 MG/ML SOAJ Inject 120 mg into the skin every 30 (thirty) days. 1 mL 11   Galcanezumab-gnlm (EMGALITY) 120 MG/ML SOAJ Inject 120 mg into the skin every 30 (thirty) days. 2 mL 0   metroNIDAZOLE (METROGEL) 0.75 % vaginal gel Insert 1 applicatorful by vaginal route at bedtime for 5 days. 70 g 0   metroNIDAZOLE (METROGEL) 0.75 % vaginal gel Insert 1 applicatorful vaginally once daily at bedtime for 5 days. 70 g 0   predniSONE (DELTASONE) 5 MG tablet Start taking 6 tablets, taper by 1 tablet daily until off 21 tablet 0   progesterone (PROMETRIUM) 100 MG capsule Take 100 mg by mouth at bedtime.     sulfamethoxazole-trimethoprim (BACTRIM DS) 800-160 MG tablet Take 1 tablet twice a day by oral route for 5 days. 10  tablet 0   No facility-administered medications prior to visit.    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    Cardiomyopathy    post-partum 2002   Dizziness    History of cardiomyopathy    Hyperlipidemia 04/28/2016   Iron deficiency anemia    Menstrual migraine    PAC (premature atrial contraction) 04/28/2016   Pain of cervical spine    Palpitations    Paresthesia    PVC (premature ventricular contraction) 04/28/2016   Right ear pain     PAST SURGICAL HISTORY: Past Surgical History:  Procedure Laterality Date   echocardiogram stress test  12/30/10   TRANSTHORACIC ECHOCARDIOGRAM  03/11/06    FAMILY HISTORY: Family History  Problem Relation Age of Onset   Cancer Maternal Grandmother        reproductive organs   Cancer Paternal Grandmother    Atrial fibrillation Mother    Heart failure Mother    CAD Mother    Hypertension Father    Atrial fibrillation Father    Peripheral Artery Disease Father    Diabetes Father     SOCIAL HISTORY: Social History   Socioeconomic History   Marital status: Single    Spouse name: Not on file   Number of children: 2    Years of education: 14   Highest education level: Bachelor's degree (e.g., BA, AB, BS)  Occupational History   Occupation: Physical therapy Electronics engineer: PRN with [employer]    Employer: UNEMPLOYED  Tobacco Use   Smoking status: Former    Types: Cigarettes    Quit date: 06/02/1989    Years since quitting: 33.3   Smokeless tobacco: Never   Tobacco comments:    started at 50, smoked less than 1 ppd; quit in 1990  Vaping Use   Vaping Use: Never used  Substance and Sexual Activity   Alcohol use: Not Currently    Alcohol/week: 8.0 standard drinks of alcohol    Types: 8 Glasses of wine per week    Comment: approximately 3-5 drinks/week    Drug use: No   Sexual activity: Yes    Partners: Male    Birth control/protection: None  Other Topics Concern   Not on file  Social History Narrative   atient has two children, 2 children (7 and 5)Physical Environmental health practitioner .Patient has a Associates degree.Patient is left-handed.Patient drinks one cup of coffee and two cups per day.   Social Determinants of Health   Financial Resource Strain: Not on file  Food Insecurity: Not on file  Transportation Needs: Not on file  Physical Activity: Not on file  Stress: Not on file  Social Connections: Not on file  Intimate Partner Violence: Not on file    PHYSICAL EXAM  Vitals:   10/14/22 0844  BP: 114/69  Pulse: 71  Weight: 105 lb 8 oz (47.9 kg)  Height: 5\' 4"  (1.626 m)   Body mass index is 18.11 kg/m.  Generalized: Well developed, in no acute distress  Neurological examination  Mentation: Alert oriented to time, place, history taking. Follows all commands speech and language fluent Cranial nerve II-XII: Pupils were equal round reactive to light. Extraocular movements were full, visual field were full on confrontational test. Facial sensation and strength were normal.  Head turning and shoulder shrug  were normal and symmetric. Motor: The motor testing reveals 5 over 5 strength of all 4  extremities. Good symmetric motor  tone is noted throughout.  Strong grip strength bilaterally.  No finger weakness noted. Sensory: Sensory testing is intact to soft touch on all 4 extremities. No evidence of extinction is noted.  Coordination: Cerebellar testing reveals good finger-nose-finger and heel-to-shin bilaterally.  Gait and station: Gait is normal. Tandem gait is normal.  Reflexes: Deep tendon reflexes are symmetric and normal bilaterally.   DIAGNOSTIC DATA (LABS, IMAGING, TESTING) - I reviewed patient records, labs, notes, testing and imaging myself where available.  Lab Results  Component Value Date   WBC 6.5 01/27/2022   HGB 13.7 01/27/2022   HCT 40.9 01/27/2022   MCV 93.8 01/27/2022   PLT 217 01/27/2022      Component Value Date/Time   NA 143 01/27/2022 0024   NA 139 02/15/2021 0933   K 3.3 (L) 01/27/2022 0024   CL 110 01/27/2022 0024   CO2 26 01/27/2022 0024   GLUCOSE 112 (H) 01/27/2022 0024   BUN 12 01/27/2022 0024   BUN 16 02/15/2021 0933   CREATININE 0.63 01/27/2022 0024   CREATININE 0.64 05/29/2017 1136   CALCIUM 9.8 01/27/2022 0024   PROT 7.0 01/27/2022 0024   PROT 6.5 02/15/2021 0933   ALBUMIN 4.2 01/27/2022 0024   ALBUMIN 4.2 02/15/2021 0933   AST 28 01/27/2022 0024   ALT 25 01/27/2022 0024   ALKPHOS 47 01/27/2022 0024   BILITOT 0.6 01/27/2022 0024   BILITOT 0.4 02/15/2021 0933   GFRNONAA >60 01/27/2022 0024   GFRNONAA 103 05/29/2017 1136   GFRAA 115 06/29/2018 0952   GFRAA 120 05/29/2017 1136   Lab Results  Component Value Date   CHOL 213 (H) 02/15/2021   HDL 80 02/15/2021   LDLCALC 117 (H) 02/15/2021   TRIG 91 02/15/2021   CHOLHDL 2.7 02/15/2021   No results found for: "HGBA1C" No results found for: "VITAMINB12" Lab Results  Component Value Date   TSH 0.567 02/15/2021    Margie Ege, AGNP-C, DNP 10/14/2022, 9:09 AM [facility] [address], [address] [place], [place] [address] [phone]

## 2022-10-21 ENCOUNTER — Other Ambulatory Visit (HOSPITAL_COMMUNITY): Payer: Self-pay

## 2022-10-21 MED ORDER — MISOPROSTOL 200 MCG PO TABS
200.0000 ug | ORAL_TABLET | ORAL | 0 refills | Status: DC
  Filled 2022-10-21: qty 1, 1d supply, fill #0

## 2022-10-21 MED ORDER — IBUPROFEN 800 MG PO TABS
800.0000 mg | ORAL_TABLET | Freq: Three times a day (TID) | ORAL | 0 refills | Status: AC | PRN
  Filled 2022-10-21: qty 20, 7d supply, fill #0

## 2022-10-22 ENCOUNTER — Other Ambulatory Visit (HOSPITAL_COMMUNITY): Payer: Self-pay

## 2022-10-23 ENCOUNTER — Other Ambulatory Visit (HOSPITAL_COMMUNITY): Payer: Self-pay

## 2022-10-24 ENCOUNTER — Other Ambulatory Visit (HOSPITAL_COMMUNITY): Payer: Self-pay

## 2022-10-25 ENCOUNTER — Other Ambulatory Visit (HOSPITAL_COMMUNITY): Payer: Self-pay

## 2022-10-27 ENCOUNTER — Other Ambulatory Visit: Payer: Self-pay

## 2022-10-28 ENCOUNTER — Other Ambulatory Visit (HOSPITAL_COMMUNITY): Payer: Self-pay

## 2022-10-29 DIAGNOSIS — N840 Polyp of corpus uteri: Secondary | ICD-10-CM | POA: Diagnosis not present

## 2022-10-29 DIAGNOSIS — N871 Moderate cervical dysplasia: Secondary | ICD-10-CM | POA: Diagnosis not present

## 2022-11-11 ENCOUNTER — Other Ambulatory Visit (HOSPITAL_COMMUNITY): Payer: Self-pay

## 2022-11-11 ENCOUNTER — Ambulatory Visit (HOSPITAL_BASED_OUTPATIENT_CLINIC_OR_DEPARTMENT_OTHER): Payer: 59 | Admitting: Family

## 2022-11-11 ENCOUNTER — Encounter (HOSPITAL_BASED_OUTPATIENT_CLINIC_OR_DEPARTMENT_OTHER): Payer: Self-pay | Admitting: Family

## 2022-11-11 VITALS — BP 126/82 | HR 66 | Ht 64.0 in | Wt 108.0 lb

## 2022-11-11 DIAGNOSIS — E785 Hyperlipidemia, unspecified: Secondary | ICD-10-CM | POA: Diagnosis not present

## 2022-11-11 DIAGNOSIS — E782 Mixed hyperlipidemia: Secondary | ICD-10-CM

## 2022-11-11 DIAGNOSIS — I25118 Atherosclerotic heart disease of native coronary artery with other forms of angina pectoris: Secondary | ICD-10-CM | POA: Diagnosis not present

## 2022-11-11 DIAGNOSIS — I493 Ventricular premature depolarization: Secondary | ICD-10-CM

## 2022-11-11 DIAGNOSIS — I491 Atrial premature depolarization: Secondary | ICD-10-CM

## 2022-11-11 DIAGNOSIS — R002 Palpitations: Secondary | ICD-10-CM

## 2022-11-11 MED ORDER — METOPROLOL SUCCINATE ER 25 MG PO TB24
25.0000 mg | ORAL_TABLET | Freq: Every day | ORAL | 3 refills | Status: AC
Start: 2022-11-11 — End: ?
  Filled 2022-11-11 – 2022-12-22 (×3): qty 90, 90d supply, fill #0
  Filled 2023-03-24: qty 90, 90d supply, fill #1
  Filled 2023-05-18 – 2023-06-29 (×3): qty 90, 90d supply, fill #2
  Filled 2023-09-25: qty 90, 90d supply, fill #3

## 2022-11-11 NOTE — Assessment & Plan Note (Signed)
Stable with no anginal symptoms. No indication for ischemic evaluation.  GDMT Rosuvastatin, Toprol. Recommend aiming for 150 minutes of moderate intensity activity per week and following a heart healthy diet.

## 2022-11-11 NOTE — Assessment & Plan Note (Signed)
Rare palpitations which are overall not bothersome. Continue Toprol 25mg  QD. Refill provided.

## 2022-11-11 NOTE — Patient Instructions (Signed)
Medication Instructions:  Continue your current medications.   *If you need a refill on your cardiac medications before your next appointment, please call your pharmacy*  Follow-Up: At [facility], you and your health needs are our priority.  As part of our continuing mission to provide you with exceptional heart care, we have created designated Provider Care Teams.  These Care Teams include your primary Cardiologist (physician) and Advanced Practice Providers (APPs -  Physician Assistants and Nurse Practitioners) who all work together to provide you with the care you need, when you need it.  We recommend signing up for the patient portal called "MyChart".  Sign up information is provided on this After Visit Summary.  MyChart is used to connect with patients for Virtual Visits (Telemedicine).  Patients are able to view lab/test results, encounter notes, upcoming appointments, etc.  Non-urgent messages can be sent to your provider as well.   To learn more about what you can do with MyChart, go to [email or web address].    Your next appointment:   1 year(s)  Provider:   Chilton Si, MD or Gillian Shields, NP    Other Instructions  To prevent palpitations: Make sure you are adequately hydrated.  Avoid and/or limit caffeine containing beverages like soda or tea. Exercise regularly.  Manage stress well. Some over the counter medications can cause palpitations such as Benadryl, AdvilPM, TylenolPM. Regular Advil or Tylenol do not cause palpitations.

## 2022-11-11 NOTE — Progress Notes (Signed)
Cardiology Office Note:  .   Date:  11/11/2022  ID:  Elmer Sow, DOB 12/30/1965, MRN [id number] PCP: Darrow Bussing, MD  [facility] Providers Cardiologist:  Chilton Si, MD    History of Present Illness: .   Lauren [name] is a 57 y.o. 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  She was last seen 06/03/2021.  Last seen 06/21/2021 by Jari Favre, PA.  She was recommended to continue Toprol 25 mg daily for palpitations and due to new symptoms ZIO monitor was recommended.  Monitor 08/2021 revealed normal sinus rhythm with rare PAC/PVC and up to 6 beats of SVT.  Coronary calcium score 09/13/2021 with coronary calcium score of 7 placing her in the 80th percentile for age/gender/race matched control with aortic atherosclerosis.  Aspirin and rosuvastatin were recommended and she politely declined statin.  Presents today for follow up independently. Exercises regularly running & playing pickleball. Following low sodium, heart healthy diet. Reports no shortness of breath nor dyspnea on exertion. Reports no chest pain, pressure, or tightness. No edema, orthopnea, PND. Reports infrequent palpitations which are overall not bothersome. PCP started Rosuvastatin 5mg  QD in July (11/2021 LDL 120 ? 01/2022 LDL 75).  ROS: Please see the history of present illness.    All other systems reviewed and are negative.   Studies Reviewed: Marland Kitchen    EKG:  EKG ordered & performed today demonstrated NSR 66 bpm with no acute ST/T wave changes.    Risk Assessment/Calculations:             Physical Exam:   VS:  BP 126/82   Pulse 66   Ht 5\' 4"  (1.626 m)   Wt 108 lb (49 kg)   LMP 10/13/2013   BMI 18.54 kg/m    Wt Readings from Last 3 Encounters:  11/11/22 108 lb (49 kg)  10/14/22 105 lb 8 oz (47.9 kg)  01/27/22 111 lb (50.3 kg)    GEN: Well nourished, well developed in no acute distress NECK: No JVD; No carotid  bruits CARDIAC: RRR, no murmurs, rubs, gallops RESPIRATORY:  Clear to auscultation without rales, wheezing or rhonchi  ABDOMEN: Soft, non-tender, non-distended EXTREMITIES:  No edema; No deformity   ASSESSMENT AND PLAN: .    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 No HF symptoms. No indication for further evaluation at this time.  PAC (premature atrial contraction) Rare palpitations which are overall not bothersome. Continue Toprol 25mg  QD. Refill provided.  Hyperlipidemia LDL goal <70. 01/2022 LDL 75 after 2 months of Rosuvastatin 5mg  QD. Continue same. Repeat lipid panel in 6-12 months. Can coordinate at follow up. Could consider Lp(a) with next labs.  Coronary artery disease of native artery of native heart with stable angina pectoris (HCC) Stable with no anginal symptoms. No indication for ischemic evaluation.  GDMT Rosuvastatin, Toprol. Recommend aiming for 150 minutes of moderate intensity activity per week and following a heart healthy diet.          Dispo: follow up in 1 year with Dr. Duke Salvia  Signed, Alver Sorrow, NP

## 2022-11-11 NOTE — Assessment & Plan Note (Signed)
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 No HF symptoms. No indication for further evaluation at this time.

## 2022-11-11 NOTE — Assessment & Plan Note (Addendum)
LDL goal <70. 01/2022 LDL 75 after 2 months of Rosuvastatin 5mg  QD. Continue same. Repeat lipid panel in 6-12 months. Can coordinate at follow up. Could consider Lp(a) with next labs.

## 2022-11-24 ENCOUNTER — Other Ambulatory Visit: Payer: Self-pay

## 2022-11-26 ENCOUNTER — Other Ambulatory Visit (HOSPITAL_COMMUNITY): Payer: Self-pay

## 2022-12-12 ENCOUNTER — Other Ambulatory Visit: Payer: Self-pay | Admitting: Oncology

## 2022-12-12 DIAGNOSIS — Z006 Encounter for examination for normal comparison and control in clinical research program: Secondary | ICD-10-CM

## 2022-12-22 ENCOUNTER — Other Ambulatory Visit: Payer: Self-pay

## 2022-12-22 ENCOUNTER — Other Ambulatory Visit (HOSPITAL_COMMUNITY): Payer: Self-pay

## 2022-12-22 MED ORDER — ROSUVASTATIN CALCIUM 5 MG PO TABS
5.0000 mg | ORAL_TABLET | Freq: Every day | ORAL | 11 refills | Status: DC
  Filled 2022-12-22: qty 30, 30d supply, fill #0
  Filled 2023-01-19: qty 30, 30d supply, fill #1
  Filled 2023-03-12: qty 30, 30d supply, fill #2
  Filled 2023-03-24 – 2023-04-09 (×2): qty 30, 30d supply, fill #3
  Filled 2023-05-18: qty 30, 30d supply, fill #4
  Filled 2023-06-16 – 2023-06-29 (×2): qty 30, 30d supply, fill #5
  Filled 2023-08-10 – 2023-08-21 (×2): qty 30, 30d supply, fill #6
  Filled 2023-09-25: qty 30, 30d supply, fill #7
  Filled 2023-11-13 – 2023-12-15 (×2): qty 30, 30d supply, fill #8

## 2022-12-23 ENCOUNTER — Other Ambulatory Visit (HOSPITAL_COMMUNITY): Payer: Self-pay

## 2022-12-23 MED ORDER — NITROFURANTOIN MONOHYD MACRO 100 MG PO CAPS
100.0000 mg | ORAL_CAPSULE | Freq: Two times a day (BID) | ORAL | 0 refills | Status: DC
  Filled 2022-12-23 – 2023-03-24 (×3): qty 14, 7d supply, fill #0

## 2022-12-23 MED ORDER — NITROFURANTOIN MONOHYD MACRO 100 MG PO CAPS
100.0000 mg | ORAL_CAPSULE | Freq: Two times a day (BID) | ORAL | 0 refills | Status: DC
  Filled 2022-12-23 (×2): qty 14, 7d supply, fill #0

## 2022-12-25 ENCOUNTER — Other Ambulatory Visit (HOSPITAL_COMMUNITY): Payer: Self-pay

## 2023-01-05 ENCOUNTER — Other Ambulatory Visit (HOSPITAL_COMMUNITY): Payer: Self-pay

## 2023-01-19 ENCOUNTER — Other Ambulatory Visit (HOSPITAL_COMMUNITY): Payer: Self-pay

## 2023-01-19 ENCOUNTER — Other Ambulatory Visit: Payer: Self-pay

## 2023-01-19 DIAGNOSIS — E7800 Pure hypercholesterolemia, unspecified: Secondary | ICD-10-CM | POA: Diagnosis not present

## 2023-01-19 DIAGNOSIS — R300 Dysuria: Secondary | ICD-10-CM | POA: Diagnosis not present

## 2023-01-19 DIAGNOSIS — Z131 Encounter for screening for diabetes mellitus: Secondary | ICD-10-CM | POA: Diagnosis not present

## 2023-01-19 DIAGNOSIS — Z0000 Encounter for general adult medical examination without abnormal findings: Secondary | ICD-10-CM | POA: Diagnosis not present

## 2023-01-19 DIAGNOSIS — R319 Hematuria, unspecified: Secondary | ICD-10-CM | POA: Diagnosis not present

## 2023-01-19 MED ORDER — SULFAMETHOXAZOLE-TRIMETHOPRIM 800-160 MG PO TABS
1.0000 | ORAL_TABLET | Freq: Two times a day (BID) | ORAL | 0 refills | Status: DC
  Filled 2023-01-19: qty 10, 5d supply, fill #0

## 2023-01-19 MED ORDER — FLUCONAZOLE 150 MG PO TABS
150.0000 mg | ORAL_TABLET | ORAL | 0 refills | Status: DC
  Filled 2023-01-19: qty 2, 6d supply, fill #0

## 2023-01-20 DIAGNOSIS — G43009 Migraine without aura, not intractable, without status migrainosus: Secondary | ICD-10-CM | POA: Diagnosis not present

## 2023-01-20 DIAGNOSIS — Z0000 Encounter for general adult medical examination without abnormal findings: Secondary | ICD-10-CM | POA: Diagnosis not present

## 2023-01-29 ENCOUNTER — Other Ambulatory Visit (HOSPITAL_COMMUNITY): Payer: Self-pay

## 2023-02-24 ENCOUNTER — Telehealth: Payer: Self-pay | Admitting: Pharmacy Technician

## 2023-02-24 ENCOUNTER — Other Ambulatory Visit (HOSPITAL_COMMUNITY): Payer: Self-pay

## 2023-02-24 NOTE — Telephone Encounter (Signed)
Pharmacy Patient Advocate Encounter   Received notification from CoverMyMeds that prior authorization for Emgality 120MG /ML auto-injectors (migraine) is required/requested.   Insurance verification completed.   The patient is insured through [facility] .   Per test claim: PA required; PA submitted to [facility] via CoverMyMeds Key/confirmation #/EOC [id number] Status is pending

## 2023-02-27 ENCOUNTER — Other Ambulatory Visit (HOSPITAL_COMMUNITY): Payer: Self-pay

## 2023-02-27 NOTE — Telephone Encounter (Signed)
Pharmacy Patient Advocate Encounter  Received notification from [facility] that Prior Authorization for Emgality 120MG /ML auto-injectors (migraine) has been APPROVED from 02/25/2023 to 02/24/2024. Ran test claim, Copay is $125.00 per 30DS/1ML. This test claim was processed through [facility]- copay amounts may vary at other pharmacies due to pharmacy/plan contracts, or as the patient moves through the different stages of their insurance plan.   PA #/Case ID/Reference #: [id number]

## 2023-03-02 DIAGNOSIS — N840 Polyp of corpus uteri: Secondary | ICD-10-CM | POA: Diagnosis not present

## 2023-03-02 DIAGNOSIS — Z1151 Encounter for screening for human papillomavirus (HPV): Secondary | ICD-10-CM | POA: Diagnosis not present

## 2023-03-02 DIAGNOSIS — Z124 Encounter for screening for malignant neoplasm of cervix: Secondary | ICD-10-CM | POA: Diagnosis not present

## 2023-03-02 DIAGNOSIS — N858 Other specified noninflammatory disorders of uterus: Secondary | ICD-10-CM | POA: Diagnosis not present

## 2023-03-12 ENCOUNTER — Other Ambulatory Visit: Payer: Self-pay

## 2023-03-17 ENCOUNTER — Other Ambulatory Visit (HOSPITAL_COMMUNITY): Payer: Self-pay

## 2023-03-18 DIAGNOSIS — N879 Dysplasia of cervix uteri, unspecified: Secondary | ICD-10-CM | POA: Diagnosis not present

## 2023-03-24 ENCOUNTER — Other Ambulatory Visit (HOSPITAL_COMMUNITY): Payer: Self-pay

## 2023-03-24 ENCOUNTER — Other Ambulatory Visit: Payer: Self-pay

## 2023-03-25 ENCOUNTER — Other Ambulatory Visit: Payer: Self-pay

## 2023-03-25 ENCOUNTER — Other Ambulatory Visit (HOSPITAL_COMMUNITY): Payer: Self-pay

## 2023-03-25 MED ORDER — PROGESTERONE MICRONIZED 100 MG PO CAPS
100.0000 mg | ORAL_CAPSULE | Freq: Every day | ORAL | 2 refills | Status: DC
  Filled 2023-03-25: qty 90, 90d supply, fill #0
  Filled 2023-05-18 – 2023-06-29 (×3): qty 90, 90d supply, fill #1
  Filled 2023-09-25: qty 90, 90d supply, fill #2

## 2023-03-27 ENCOUNTER — Other Ambulatory Visit (HOSPITAL_COMMUNITY): Payer: Self-pay

## 2023-03-27 DIAGNOSIS — L814 Other melanin hyperpigmentation: Secondary | ICD-10-CM | POA: Diagnosis not present

## 2023-03-27 DIAGNOSIS — L720 Epidermal cyst: Secondary | ICD-10-CM | POA: Diagnosis not present

## 2023-03-27 DIAGNOSIS — L570 Actinic keratosis: Secondary | ICD-10-CM | POA: Diagnosis not present

## 2023-03-27 DIAGNOSIS — D225 Melanocytic nevi of trunk: Secondary | ICD-10-CM | POA: Diagnosis not present

## 2023-03-27 DIAGNOSIS — L821 Other seborrheic keratosis: Secondary | ICD-10-CM | POA: Diagnosis not present

## 2023-03-27 DIAGNOSIS — D2261 Melanocytic nevi of right upper limb, including shoulder: Secondary | ICD-10-CM | POA: Diagnosis not present

## 2023-03-27 MED ORDER — FLUOROURACIL 5 % EX CREA
TOPICAL_CREAM | CUTANEOUS | 0 refills | Status: DC
  Filled 2023-03-27: qty 40, 30d supply, fill #0

## 2023-04-01 DIAGNOSIS — N879 Dysplasia of cervix uteri, unspecified: Secondary | ICD-10-CM | POA: Diagnosis not present

## 2023-04-09 ENCOUNTER — Other Ambulatory Visit (HOSPITAL_COMMUNITY): Payer: Self-pay

## 2023-04-09 ENCOUNTER — Other Ambulatory Visit: Payer: Self-pay

## 2023-04-13 ENCOUNTER — Other Ambulatory Visit (HOSPITAL_COMMUNITY): Payer: Self-pay

## 2023-04-13 MED ORDER — ESTRADIOL 0.05 MG/24HR TD PTTW
MEDICATED_PATCH | TRANSDERMAL | 3 refills | Status: AC
  Filled 2023-04-13: qty 24, 84d supply, fill #0
  Filled 2023-05-18 – 2023-06-19 (×3): qty 24, 84d supply, fill #1

## 2023-04-18 ENCOUNTER — Other Ambulatory Visit (HOSPITAL_COMMUNITY): Payer: Self-pay

## 2023-04-22 DIAGNOSIS — Z1231 Encounter for screening mammogram for malignant neoplasm of breast: Secondary | ICD-10-CM | POA: Diagnosis not present

## 2023-04-22 DIAGNOSIS — Z01419 Encounter for gynecological examination (general) (routine) without abnormal findings: Secondary | ICD-10-CM | POA: Diagnosis not present

## 2023-04-22 DIAGNOSIS — Z681 Body mass index (BMI) 19 or less, adult: Secondary | ICD-10-CM | POA: Diagnosis not present

## 2023-04-22 DIAGNOSIS — Z13820 Encounter for screening for osteoporosis: Secondary | ICD-10-CM | POA: Diagnosis not present

## 2023-04-29 ENCOUNTER — Other Ambulatory Visit (HOSPITAL_COMMUNITY): Payer: 59 | Attending: Oncology

## 2023-05-19 ENCOUNTER — Other Ambulatory Visit: Payer: Self-pay

## 2023-05-19 ENCOUNTER — Other Ambulatory Visit (HOSPITAL_COMMUNITY): Payer: Self-pay

## 2023-05-19 DIAGNOSIS — M4310 Spondylolisthesis, site unspecified: Secondary | ICD-10-CM | POA: Diagnosis not present

## 2023-05-19 DIAGNOSIS — M1611 Unilateral primary osteoarthritis, right hip: Secondary | ICD-10-CM | POA: Diagnosis not present

## 2023-05-19 DIAGNOSIS — M7061 Trochanteric bursitis, right hip: Secondary | ICD-10-CM | POA: Diagnosis not present

## 2023-05-19 DIAGNOSIS — M5416 Radiculopathy, lumbar region: Secondary | ICD-10-CM | POA: Diagnosis not present

## 2023-05-19 MED ORDER — METHYLPREDNISOLONE 4 MG PO TBPK
ORAL_TABLET | ORAL | 0 refills | Status: DC
  Filled 2023-05-19: qty 21, 6d supply, fill #0

## 2023-05-22 ENCOUNTER — Other Ambulatory Visit (HOSPITAL_COMMUNITY): Payer: Self-pay

## 2023-05-22 DIAGNOSIS — M7061 Trochanteric bursitis, right hip: Secondary | ICD-10-CM | POA: Diagnosis not present

## 2023-05-22 DIAGNOSIS — M25551 Pain in right hip: Secondary | ICD-10-CM | POA: Diagnosis not present

## 2023-06-08 ENCOUNTER — Other Ambulatory Visit (HOSPITAL_COMMUNITY): Payer: Self-pay

## 2023-06-12 ENCOUNTER — Other Ambulatory Visit (HOSPITAL_BASED_OUTPATIENT_CLINIC_OR_DEPARTMENT_OTHER): Payer: Self-pay

## 2023-06-12 ENCOUNTER — Other Ambulatory Visit (HOSPITAL_COMMUNITY): Payer: Self-pay

## 2023-06-12 MED ORDER — NITROFURANTOIN MONOHYD MACRO 100 MG PO CAPS
100.0000 mg | ORAL_CAPSULE | Freq: Two times a day (BID) | ORAL | 0 refills | Status: DC
  Filled 2023-06-12: qty 14, 7d supply, fill #0

## 2023-06-16 ENCOUNTER — Other Ambulatory Visit: Payer: Self-pay

## 2023-06-16 ENCOUNTER — Other Ambulatory Visit (HOSPITAL_COMMUNITY): Payer: Self-pay

## 2023-06-19 ENCOUNTER — Other Ambulatory Visit: Payer: Self-pay

## 2023-06-23 ENCOUNTER — Other Ambulatory Visit (HOSPITAL_COMMUNITY): Payer: Self-pay

## 2023-06-26 ENCOUNTER — Other Ambulatory Visit (HOSPITAL_COMMUNITY): Payer: Self-pay

## 2023-08-21 ENCOUNTER — Other Ambulatory Visit (HOSPITAL_COMMUNITY): Payer: Self-pay

## 2023-08-28 ENCOUNTER — Other Ambulatory Visit (HOSPITAL_COMMUNITY): Payer: Self-pay

## 2023-08-28 DIAGNOSIS — M7061 Trochanteric bursitis, right hip: Secondary | ICD-10-CM | POA: Diagnosis not present

## 2023-08-28 MED ORDER — DICLOFENAC SODIUM 75 MG PO TBEC
75.0000 mg | DELAYED_RELEASE_TABLET | Freq: Two times a day (BID) | ORAL | 2 refills | Status: DC
  Filled 2023-08-28: qty 60, 30d supply, fill #0
  Filled 2023-09-25: qty 60, 30d supply, fill #1

## 2023-09-25 ENCOUNTER — Other Ambulatory Visit (HOSPITAL_COMMUNITY): Payer: Self-pay

## 2023-09-25 ENCOUNTER — Other Ambulatory Visit: Payer: Self-pay

## 2023-10-14 ENCOUNTER — Ambulatory Visit (INDEPENDENT_AMBULATORY_CARE_PROVIDER_SITE_OTHER): Payer: 59 | Admitting: Neurology

## 2023-10-14 ENCOUNTER — Encounter: Payer: Self-pay | Admitting: Neurology

## 2023-10-14 ENCOUNTER — Other Ambulatory Visit (HOSPITAL_COMMUNITY): Payer: Self-pay

## 2023-10-14 VITALS — BP 114/78 | HR 72 | Ht 64.0 in | Wt 117.0 lb

## 2023-10-14 DIAGNOSIS — G43709 Chronic migraine without aura, not intractable, without status migrainosus: Secondary | ICD-10-CM

## 2023-10-14 DIAGNOSIS — M25551 Pain in right hip: Secondary | ICD-10-CM | POA: Diagnosis not present

## 2023-10-14 MED ORDER — SUMATRIPTAN SUCCINATE 100 MG PO TABS
100.0000 mg | ORAL_TABLET | Freq: Once | ORAL | 11 refills | Status: AC | PRN
  Filled 2023-10-14 – 2023-12-03 (×3): qty 9, 30d supply, fill #0
  Filled 2023-12-15 – 2023-12-19 (×3): qty 9, 30d supply, fill #1
  Filled 2023-12-31: qty 9, 30d supply, fill #2
  Filled 2024-02-10: qty 9, 30d supply, fill #3
  Filled 2024-02-28: qty 9, 30d supply, fill #4
  Filled 2024-03-01: qty 9, 30d supply, fill #0
  Filled 2024-04-05: qty 9, 30d supply, fill #1
  Filled 2024-04-24 – 2024-04-27 (×2): qty 9, 30d supply, fill #2
  Filled 2024-05-21 – 2024-05-24 (×2): qty 9, 30d supply, fill #3
  Filled 2024-06-18: qty 9, 30d supply, fill #4
  Filled ????-??-??: fill #4

## 2023-10-14 MED ORDER — TIZANIDINE HCL 2 MG PO TABS
2.0000 mg | ORAL_TABLET | Freq: Three times a day (TID) | ORAL | 5 refills | Status: AC | PRN
  Filled 2023-10-14 – 2024-04-05 (×3): qty 30, 10d supply, fill #0

## 2023-10-14 MED ORDER — PREGABALIN 75 MG PO CAPS
75.0000 mg | ORAL_CAPSULE | Freq: Every day | ORAL | 2 refills | Status: DC
  Filled 2023-10-14: qty 30, 30d supply, fill #0

## 2023-10-14 NOTE — Progress Notes (Signed)
 Patient: Lauren [name] Date of Birth: [birth year]  Reason for Visit: Follow up History from: Patient Primary Neurologist: Tresia Fruit   ASSESSMENT AND PLAN 58 y.o. year old female   1.  Chronic migraine headaches -Under stable control -Prefers to remain off Emgality  due to cost, if she would like to consider preventative may consider nortriptyline in the future.  She also has some chronic neck pain that may benefit from nortriptyline -Continue Imitrex  as needed (usually only takes 1/2 tablet at initial twinge of migraine), may combine with tizanidine  -Follow up in 1 year virtually or sooner if needed   Meds ordered this encounter  Medications   SUMAtriptan  (IMITREX ) 100 MG tablet    Sig: Take 1 tablet (100 mg) by mouth once as needed for migraine. May repeat once in 2 hours if headache persists or recurs.    Dispense:  10 tablet    Refill:  11    #10/30days   tiZANidine  (ZANAFLEX ) 2 MG tablet    Sig: Take 1 tablet (2 mg total) by mouth every 8 (eight) hours as needed.    Dispense:  30 tablet    Refill:  5   HISTORY OF PRESENT ILLNESS: Today 10/14/23 Update 10/14/23 SS: Stopped the Emgality , due to switching to high deductible plan. Migraines about the same off Emgality . About 8 migraines a month, will take Imitrex  (usually 1/2 tablet) at onset with good benefit, doesn't progress to full blown migraine, or take tizanidine  for neck pain. Tried PT for her neck in the past. Now seeing Dr. Bernard Brick for right hip.   Update 10/14/22 SS: When she last saw Dr. Tresia Fruit June 2023 complaining of acute neck pain and vision changes.  MRI cervical spine showed prominent spondylitic change at C5-6 with right paracentral disc osteophyte protrusion mild foraminal narrowing on the right.  MRI of the brain with and without contrast was unremarkable.  [facility].  Sent MyChart message to me 09/16/22 about continuous migraine, 6-day prednisone  taper pack was sent in.   Is  working as a Engineer, civil (consulting) at [employer]. She went to [facility]. They wanted to refer her to PT, wasn't sure she would benefit so she held off.   For most part her migraines are controlled. Still on Emgality , this past month was bad, 10 migraines a month. Normally, will have 4-5 a month, usually will take Imitrex . Often triggered by neck tension on the left, headaches are left sided. Headaches resolve quickly with Imitrex .   Mentions has noticed with her thumbs intermittent twitching with use, sometimes feel weak, symptoms come and go.  Has been going on for several years.  She does have to pop almost 100 pills when she works as a Engineer, civil (consulting), contributing?  Over the years has had various twitching to different parts of her body, discussed with Dr. Tilda Fogo.  In the last year, has lost around 10 pounds, unclear why? None in the last 6 months.  Has PCP.  HISTORY  Dr. Tresia Fruit 11/21/2021: She has neck pain, she woke up, she has numbness in the fingers, she woke up her inky was burning and her left eye was crossed, she thought she was having a strossed, she couldn't focus with the left eye, that has happened 2x a week, only when she wakes up, can last 5 - 10 seconds, some morenings is fine, this morning wasn;t "crossed" but it wouldn't open the left eye, 2 weeks ago the left  side of the face went numb, numbness in the hands, a round of prednisone  did not help, now gradually she has muffled hearing in the left ear, feels like she has cotton in the left ear. She has chronic neck pain, she has numbness in her right hand off and on and numbness in digits 4-5, she has neck pain with radiation into the left arm and numbness in digits 4-5. Completed 3 months of physical therapy on the neck, has been under the care of physicians since 2014 for her neck, tried PT, muscle relaxers, heat, conservative measures, analgesics. Left pulsatile tinnitus, went to the ENT   TM with possibly a little layering in the lower part  maybe some fluid  Dr. Tresia Fruit Interval history 10/08/2021:  This is a former Dr. Tilda Fogo patient with migraines transitioning to me. She was started on Emgality  but unclear why she has not been able to get it. Emgality  worked Firefighter. She has neck spasms, in the past tizanidine  and a medrol  dosepak has helped saw Jeanmarie Millet NP in the past, also has occipital headaches/neuralgia. Prior to Emgality  she had daily headaches and at least 15 migraine days a month that were moderate to severe. Emgality  brought her down > 50% and was only taking 2-4 days of imitrex  a month and the migraines she did have were less severe. Imitrex  significantly helps acutely.  Aimovig  is contraindicated due to cardiovascular risks in this patient but if can't get the emgality  would switch to Ajovy with the copay card.   REVIEW OF SYSTEMS: Out of a complete 14 system review of symptoms, the patient complains only of the following symptoms, and all other reviewed systems are negative.  See HPI  ALLERGIES: Allergies  Allergen Reactions   Doxycycline Rash and Other (See Comments)    Other Reaction(s): Not available  REACTION: rash  REACTION: rash  REACTION: rash  REACTION: rash  REACTION: rash  REACTION: rash  REACTION: rash  REACTION: rash    HOME MEDICATIONS: Outpatient Medications Prior to Visit  Medication Sig Dispense Refill   Cholecalciferol (VITAMIN D3 PO) Take 5,000 Units by mouth daily.     diclofenac  (VOLTAREN ) 75 MG EC tablet Take 1 tablet (75 mg total) by mouth 2 (two) times daily with meals 60 tablet 2   estradiol  (DOTTI ) 0.05 MG/24HR patch Place 1 patch (0.05 mg total) onto the skin 2 (two) times a week. 24 patch 3   ibuprofen  (ADVIL ) 800 MG tablet Take 1 tablet (800 mg) by mouth every 8 hours as needed for cramping. 20 tablet 0   L-Theanine 200 MG CAPS Take 200 mg by mouth.     metoprolol  succinate (TOPROL -XL) 25 MG 24 hr tablet Take 1 tablet (25 mg total) by mouth daily. Keep appointment in June for  further refills 90 tablet 3   Multiple Vitamin (MULTIVITAMIN) capsule Take 1 capsule by mouth daily.     Omega-3 1000 MG CAPS Take by mouth daily.     progesterone  (PROMETRIUM ) 100 MG capsule Take 1 capsule (100 mg total) by mouth at bedtime. 90 capsule 2   rosuvastatin  (CRESTOR ) 5 MG tablet Take 1 tablet (5 mg) by mouth daily. 30 tablet 11   SUMAtriptan  (IMITREX ) 100 MG tablet Take 1 tablet (100 mg) by mouth once as needed for migraine. May repeat once in 2 hours if headache persists or recurs. 10 tablet 11   tiZANidine  (ZANAFLEX ) 2 MG tablet Take 1 tablet (2 mg total) by mouth every 8 (eight) hours as needed.  30 tablet 5   fluorouracil  (EFUDEX ) 5 % cream Apply to right forehead and above lip 2 times a day for 2 weeks 40 g 0   Galcanezumab -gnlm (EMGALITY ) 120 MG/ML SOAJ Inject 120 mg into the skin every 30 days. 1 mL 11   methylPREDNISolone  (MEDROL ) 4 MG TBPK tablet Take as directed on package. 21 tablet 0   nitrofurantoin , macrocrystal-monohydrate, (MACROBID ) 100 MG capsule Take 1 capsule (100 mg total) by mouth 2 (two) times daily. 14 capsule 0   No facility-administered medications prior to visit.    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    Cardiomyopathy    post-partum 2002   Dizziness    History of cardiomyopathy    Hyperlipidemia 04/28/2016   Iron deficiency anemia    Menstrual migraine    PAC (premature atrial contraction) 04/28/2016   Pain of cervical spine    Palpitations    Paresthesia    PVC (premature ventricular contraction) 04/28/2016   Right ear pain     PAST SURGICAL HISTORY: Past Surgical History:  Procedure Laterality Date   echocardiogram stress test  12/30/10   TRANSTHORACIC ECHOCARDIOGRAM  03/11/06    FAMILY HISTORY: Family History  Problem Relation Age of Onset   Cancer Maternal Grandmother        reproductive organs   Cancer Paternal Grandmother    Atrial  fibrillation Mother    Heart failure Mother    CAD Mother    Hypertension Father    Atrial fibrillation Father    Peripheral Artery Disease Father    Diabetes Father     SOCIAL HISTORY: Social History   Socioeconomic History   Marital status: Single    Spouse name: Not on file   Number of children: 2   Years of education: 14   Highest education level: Bachelor's degree (e.g., BA, AB, BS)  Occupational History   Occupation: Physical therapy Electronics engineer: PRN with [employer]    Employer: UNEMPLOYED  Tobacco Use   Smoking status: Former    Current packs/day: 0.00    Types: Cigarettes    Quit date: 06/02/1989    Years since quitting: 34.3   Smokeless tobacco: Never   Tobacco comments:    started at 62, smoked less than 1 ppd; quit in 1990  Vaping Use   Vaping status: Never Used  Substance and Sexual Activity   Alcohol use: Not Currently    Alcohol/week: 8.0 standard drinks of alcohol    Types: 8 Glasses of wine per week    Comment: approximately 3-5 drinks/week    Drug use: No   Sexual activity: Yes    Partners: Male    Birth control/protection: None  Other Topics Concern   Not on file  Social History Narrative   atient has two children, 2 children (7 and 5)Physical Environmental health practitioner .Patient has a Associates degree.Patient is left-handed.Patient drinks one cup of coffee and two cups per day.   Social Drivers of Corporate investment banker Strain: Not on file  Food Insecurity: Not on file  Transportation Needs: Not on file  Physical Activity: Not on file  Stress: Not on file  Social Connections: Not on file  Intimate Partner Violence: Not on file    PHYSICAL EXAM  Vitals:   10/14/23 0846  BP: 114/78  Pulse: 72  Weight: 117 lb (53.1 kg)  Height: 5\' 4"  (1.626 m)  Body mass index is 20.08 kg/m.  Generalized: Well developed, in no acute distress  Neurological examination  Mentation: Alert oriented to time, place, history taking. Follows all  commands speech and language fluent Cranial nerve II-XII: Pupils were equal round reactive to light. Extraocular movements were full, visual field were full on confrontational test. Facial sensation and strength were normal.  Head turning and shoulder shrug  were normal and symmetric. Motor: The motor testing reveals 5 over 5 strength of all 4 extremities. Good symmetric motor tone is noted throughout.  Strong grip strength bilaterally.  No finger weakness noted. Sensory: Sensory testing is intact to soft touch on all 4 extremities. No evidence of extinction is noted.  Coordination: Cerebellar testing reveals good finger-nose-finger and heel-to-shin bilaterally.  Gait and station: Gait is normal.  Reflexes: Deep tendon reflexes are symmetric and normal bilaterally.   DIAGNOSTIC DATA (LABS, IMAGING, TESTING) - I reviewed patient records, labs, notes, testing and imaging myself where available.  Lab Results  Component Value Date   WBC 6.5 01/27/2022   HGB 13.7 01/27/2022   HCT 40.9 01/27/2022   MCV 93.8 01/27/2022   PLT 217 01/27/2022      Component Value Date/Time   NA 143 01/27/2022 0024   NA 139 02/15/2021 0933   K 3.3 (L) 01/27/2022 0024   CL 110 01/27/2022 0024   CO2 26 01/27/2022 0024   GLUCOSE 112 (H) 01/27/2022 0024   BUN 12 01/27/2022 0024   BUN 16 02/15/2021 0933   CREATININE 0.63 01/27/2022 0024   CREATININE 0.64 05/29/2017 1136   CALCIUM  9.8 01/27/2022 0024   PROT 7.0 01/27/2022 0024   PROT 6.5 02/15/2021 0933   ALBUMIN 4.2 01/27/2022 0024   ALBUMIN 4.2 02/15/2021 0933   AST 28 01/27/2022 0024   ALT 25 01/27/2022 0024   ALKPHOS 47 01/27/2022 0024   BILITOT 0.6 01/27/2022 0024   BILITOT 0.4 02/15/2021 0933   GFRNONAA >60 01/27/2022 0024   GFRNONAA 103 05/29/2017 1136   GFRAA 115 06/29/2018 0952   GFRAA 120 05/29/2017 1136   Lab Results  Component Value Date   CHOL 213 (H) 02/15/2021   HDL 80 02/15/2021   LDLCALC 117 (H) 02/15/2021   TRIG 91 02/15/2021    CHOLHDL 2.7 02/15/2021   No results found for: "HGBA1C" No results found for: "VITAMINB12" Lab Results  Component Value Date   TSH 0.567 02/15/2021   Jeanmarie Millet, AGNP-C, DNP 10/14/2023, 8:55 AM [facility] [address], [address] [place], [place] [address] [phone]

## 2023-10-14 NOTE — Patient Instructions (Signed)
 Great to see you today.  Will continue current medications.  If your migraines increase please let me know.  I have sent in refills.  Plan to follow-up in 1 year virtually.  Thanks!!  Meds ordered this encounter  Medications   SUMAtriptan  (IMITREX ) 100 MG tablet    Sig: Take 1 tablet (100 mg) by mouth once as needed for migraine. May repeat once in 2 hours if headache persists or recurs.    Dispense:  10 tablet    Refill:  11    #10/30days   tiZANidine  (ZANAFLEX ) 2 MG tablet    Sig: Take 1 tablet (2 mg total) by mouth every 8 (eight) hours as needed.    Dispense:  30 tablet    Refill:  5   .

## 2023-10-15 ENCOUNTER — Other Ambulatory Visit: Payer: Self-pay

## 2023-10-21 ENCOUNTER — Other Ambulatory Visit: Payer: Self-pay | Admitting: Obstetrics and Gynecology

## 2023-10-21 DIAGNOSIS — N644 Mastodynia: Secondary | ICD-10-CM | POA: Diagnosis not present

## 2023-10-21 DIAGNOSIS — R87810 Cervical high risk human papillomavirus (HPV) DNA test positive: Secondary | ICD-10-CM | POA: Diagnosis not present

## 2023-10-21 DIAGNOSIS — N6312 Unspecified lump in the right breast, upper inner quadrant: Secondary | ICD-10-CM

## 2023-10-21 DIAGNOSIS — N870 Mild cervical dysplasia: Secondary | ICD-10-CM | POA: Diagnosis not present

## 2023-10-23 ENCOUNTER — Other Ambulatory Visit (HOSPITAL_COMMUNITY): Payer: Self-pay

## 2023-10-27 ENCOUNTER — Ambulatory Visit
Admission: RE | Admit: 2023-10-27 | Discharge: 2023-10-27 | Disposition: A | Discharge status: Home / Self Care | Source: Ambulatory Visit | Attending: Obstetrics and Gynecology | Admitting: Obstetrics and Gynecology

## 2023-10-27 DIAGNOSIS — N6312 Unspecified lump in the right breast, upper inner quadrant: Secondary | ICD-10-CM

## 2023-10-27 DIAGNOSIS — N6313 Unspecified lump in the right breast, lower outer quadrant: Secondary | ICD-10-CM | POA: Diagnosis not present

## 2023-11-13 ENCOUNTER — Other Ambulatory Visit (HOSPITAL_BASED_OUTPATIENT_CLINIC_OR_DEPARTMENT_OTHER): Payer: Self-pay | Admitting: Family

## 2023-11-13 ENCOUNTER — Other Ambulatory Visit (HOSPITAL_COMMUNITY): Payer: Self-pay

## 2023-11-13 ENCOUNTER — Other Ambulatory Visit: Payer: Self-pay

## 2023-11-13 DIAGNOSIS — R002 Palpitations: Secondary | ICD-10-CM

## 2023-11-13 DIAGNOSIS — I493 Ventricular premature depolarization: Secondary | ICD-10-CM

## 2023-11-13 DIAGNOSIS — I491 Atrial premature depolarization: Secondary | ICD-10-CM

## 2023-11-13 DIAGNOSIS — I25118 Atherosclerotic heart disease of native coronary artery with other forms of angina pectoris: Secondary | ICD-10-CM

## 2023-11-13 MED ORDER — METOPROLOL SUCCINATE ER 25 MG PO TB24
25.0000 mg | ORAL_TABLET | Freq: Every day | ORAL | 0 refills | Status: DC
  Filled 2023-11-13 – 2023-12-31 (×2): qty 30, 30d supply, fill #0

## 2023-11-16 ENCOUNTER — Other Ambulatory Visit (HOSPITAL_COMMUNITY): Payer: Self-pay

## 2023-11-23 ENCOUNTER — Other Ambulatory Visit (HOSPITAL_COMMUNITY): Payer: Self-pay

## 2023-11-27 ENCOUNTER — Other Ambulatory Visit (HOSPITAL_COMMUNITY): Payer: Self-pay

## 2023-11-27 ENCOUNTER — Encounter (HOSPITAL_COMMUNITY): Payer: Self-pay

## 2023-11-27 MED ORDER — NITROFURANTOIN MONOHYD MACRO 100 MG PO CAPS
ORAL_CAPSULE | ORAL | 0 refills | Status: DC
  Filled 2023-11-27: qty 10, fill #0
  Filled 2023-11-27 (×3): qty 10, 5d supply, fill #0

## 2023-12-03 ENCOUNTER — Other Ambulatory Visit: Payer: Self-pay

## 2023-12-03 ENCOUNTER — Other Ambulatory Visit (HOSPITAL_COMMUNITY): Payer: Self-pay

## 2023-12-07 DIAGNOSIS — N950 Postmenopausal bleeding: Secondary | ICD-10-CM | POA: Diagnosis not present

## 2023-12-07 DIAGNOSIS — N390 Urinary tract infection, site not specified: Secondary | ICD-10-CM | POA: Diagnosis not present

## 2023-12-07 DIAGNOSIS — N939 Abnormal uterine and vaginal bleeding, unspecified: Secondary | ICD-10-CM | POA: Diagnosis not present

## 2023-12-11 ENCOUNTER — Encounter: Payer: Self-pay | Admitting: Neurology

## 2023-12-11 DIAGNOSIS — M25551 Pain in right hip: Secondary | ICD-10-CM | POA: Diagnosis not present

## 2023-12-12 ENCOUNTER — Other Ambulatory Visit (HOSPITAL_COMMUNITY): Payer: Self-pay

## 2023-12-12 MED ORDER — PREDNISONE 5 MG PO TABS
ORAL_TABLET | ORAL | 0 refills | Status: DC
  Filled 2023-12-12: qty 21, 6d supply, fill #0

## 2023-12-15 ENCOUNTER — Other Ambulatory Visit: Payer: Self-pay

## 2023-12-16 DIAGNOSIS — N950 Postmenopausal bleeding: Secondary | ICD-10-CM | POA: Diagnosis not present

## 2023-12-18 ENCOUNTER — Other Ambulatory Visit (HOSPITAL_BASED_OUTPATIENT_CLINIC_OR_DEPARTMENT_OTHER): Payer: Self-pay

## 2023-12-19 ENCOUNTER — Other Ambulatory Visit (HOSPITAL_COMMUNITY): Payer: Self-pay

## 2023-12-31 ENCOUNTER — Other Ambulatory Visit (HOSPITAL_COMMUNITY): Payer: Self-pay

## 2023-12-31 ENCOUNTER — Encounter: Payer: Self-pay | Admitting: Neurology

## 2023-12-31 ENCOUNTER — Other Ambulatory Visit (HOSPITAL_BASED_OUTPATIENT_CLINIC_OR_DEPARTMENT_OTHER): Payer: Self-pay

## 2023-12-31 ENCOUNTER — Other Ambulatory Visit: Payer: Self-pay

## 2024-01-04 ENCOUNTER — Telehealth (HOSPITAL_COMMUNITY): Payer: Self-pay | Admitting: Pharmacy Technician

## 2024-01-04 ENCOUNTER — Other Ambulatory Visit (HOSPITAL_COMMUNITY): Payer: Self-pay

## 2024-01-04 ENCOUNTER — Encounter (HOSPITAL_COMMUNITY): Payer: Self-pay

## 2024-01-04 MED ORDER — EMGALITY 120 MG/ML ~~LOC~~ SOAJ
120.0000 mg | SUBCUTANEOUS | 11 refills | Status: AC
  Filled 2024-01-04: qty 1, 30d supply, fill #0
  Filled 2024-01-04: qty 2, 60d supply, fill #0
  Filled 2024-01-05 – 2024-02-04 (×7): qty 1, 30d supply, fill #0
  Filled 2024-02-28: qty 1, 30d supply, fill #1
  Filled 2024-03-18 – 2024-04-02 (×4): qty 1, 30d supply, fill #2
  Filled 2024-04-24: qty 1, 30d supply, fill #3
  Filled 2024-05-21: qty 1, 30d supply, fill #4
  Filled 2024-05-24: qty 1, 30d supply, fill #0
  Filled 2024-06-21 – 2024-06-29 (×2): qty 1, 30d supply, fill #1

## 2024-01-04 MED ORDER — PROGESTERONE MICRONIZED 100 MG PO CAPS
100.0000 mg | ORAL_CAPSULE | Freq: Every day | ORAL | 0 refills | Status: DC
  Filled 2024-01-04: qty 90, 90d supply, fill #0

## 2024-01-04 MED ORDER — EMGALITY 120 MG/ML ~~LOC~~ SOAJ
240.0000 mg | SUBCUTANEOUS | 0 refills | Status: DC
  Filled 2024-01-04 – 2024-01-09 (×4): qty 2, 30d supply, fill #0

## 2024-01-04 NOTE — Addendum Note (Signed)
 Addended by: GAYLAND LAURAINE PARAS on: 01/04/2024 07:51 AM   Modules accepted: Orders

## 2024-01-05 ENCOUNTER — Other Ambulatory Visit (HOSPITAL_COMMUNITY): Payer: Self-pay

## 2024-01-05 ENCOUNTER — Other Ambulatory Visit: Payer: Self-pay

## 2024-01-06 ENCOUNTER — Other Ambulatory Visit (HOSPITAL_BASED_OUTPATIENT_CLINIC_OR_DEPARTMENT_OTHER): Payer: Self-pay

## 2024-01-06 ENCOUNTER — Telehealth: Payer: Self-pay

## 2024-01-06 ENCOUNTER — Other Ambulatory Visit (HOSPITAL_COMMUNITY): Payer: Self-pay

## 2024-01-06 NOTE — Telephone Encounter (Signed)
 Pharmacy Patient Advocate Encounter   Received notification from RX Request Messages that prior authorization for Emgality  120MG /ML auto-injectors (migraine) is required/requested.   Insurance verification completed.   The patient is insured through [facility] .   Per test claim: PA required; PA submitted to above mentioned insurance via CoverMyMeds Key/confirmation #/EOC [id number] Status is pending

## 2024-01-08 ENCOUNTER — Other Ambulatory Visit (HOSPITAL_COMMUNITY): Payer: Self-pay

## 2024-01-09 ENCOUNTER — Other Ambulatory Visit (HOSPITAL_COMMUNITY): Payer: Self-pay

## 2024-01-11 ENCOUNTER — Other Ambulatory Visit (HOSPITAL_COMMUNITY): Payer: Self-pay

## 2024-01-11 NOTE — Telephone Encounter (Signed)
 Pharmacy Patient Advocate Encounter  Received notification from [facility] that Prior Authorization for Emgality  120MG /ML auto-injectors (migraine) has been APPROVED from 01/08/2024 to 02/07/2024. Unable to obtain price due to refill too soon rejection, last fill date 01/09/2024 next available fill date9/07/2023   PA #/Case ID/Reference #: [id number]

## 2024-01-24 ENCOUNTER — Other Ambulatory Visit (HOSPITAL_COMMUNITY): Payer: Self-pay

## 2024-01-25 ENCOUNTER — Other Ambulatory Visit (HOSPITAL_COMMUNITY): Payer: Self-pay

## 2024-01-25 DIAGNOSIS — E7800 Pure hypercholesterolemia, unspecified: Secondary | ICD-10-CM | POA: Diagnosis not present

## 2024-01-25 DIAGNOSIS — Z0000 Encounter for general adult medical examination without abnormal findings: Secondary | ICD-10-CM | POA: Diagnosis not present

## 2024-01-25 DIAGNOSIS — G43009 Migraine without aura, not intractable, without status migrainosus: Secondary | ICD-10-CM | POA: Diagnosis not present

## 2024-01-25 MED ORDER — ROSUVASTATIN CALCIUM 5 MG PO TABS
5.0000 mg | ORAL_TABLET | Freq: Every day | ORAL | 11 refills | Status: DC
  Filled 2024-01-25 – 2024-02-06 (×2): qty 30, 30d supply, fill #0
  Filled 2024-03-01 – 2024-03-18 (×3): qty 30, 30d supply, fill #1
  Filled 2024-04-05 – 2024-04-11 (×3): qty 30, 30d supply, fill #2

## 2024-01-29 ENCOUNTER — Other Ambulatory Visit: Payer: Self-pay

## 2024-01-29 ENCOUNTER — Other Ambulatory Visit (HOSPITAL_COMMUNITY): Payer: Self-pay

## 2024-02-04 ENCOUNTER — Other Ambulatory Visit: Payer: Self-pay

## 2024-02-04 ENCOUNTER — Other Ambulatory Visit (HOSPITAL_COMMUNITY): Payer: Self-pay

## 2024-02-04 ENCOUNTER — Other Ambulatory Visit (HOSPITAL_BASED_OUTPATIENT_CLINIC_OR_DEPARTMENT_OTHER): Payer: Self-pay | Admitting: Family

## 2024-02-04 DIAGNOSIS — I25118 Atherosclerotic heart disease of native coronary artery with other forms of angina pectoris: Secondary | ICD-10-CM

## 2024-02-04 DIAGNOSIS — R002 Palpitations: Secondary | ICD-10-CM

## 2024-02-04 DIAGNOSIS — I493 Ventricular premature depolarization: Secondary | ICD-10-CM

## 2024-02-04 DIAGNOSIS — I491 Atrial premature depolarization: Secondary | ICD-10-CM

## 2024-02-04 MED ORDER — METOPROLOL SUCCINATE ER 25 MG PO TB24
25.0000 mg | ORAL_TABLET | Freq: Every day | ORAL | 0 refills | Status: DC
  Filled 2024-02-04: qty 90, 90d supply, fill #0

## 2024-02-06 ENCOUNTER — Other Ambulatory Visit (HOSPITAL_COMMUNITY): Payer: Self-pay

## 2024-02-28 ENCOUNTER — Other Ambulatory Visit (HOSPITAL_BASED_OUTPATIENT_CLINIC_OR_DEPARTMENT_OTHER): Payer: Self-pay | Admitting: Family

## 2024-02-28 DIAGNOSIS — I493 Ventricular premature depolarization: Secondary | ICD-10-CM

## 2024-02-28 DIAGNOSIS — R002 Palpitations: Secondary | ICD-10-CM

## 2024-02-28 DIAGNOSIS — I25118 Atherosclerotic heart disease of native coronary artery with other forms of angina pectoris: Secondary | ICD-10-CM

## 2024-02-28 DIAGNOSIS — I491 Atrial premature depolarization: Secondary | ICD-10-CM

## 2024-02-29 ENCOUNTER — Other Ambulatory Visit: Payer: Self-pay

## 2024-02-29 ENCOUNTER — Other Ambulatory Visit (HOSPITAL_COMMUNITY): Payer: Self-pay

## 2024-03-01 ENCOUNTER — Other Ambulatory Visit: Payer: Self-pay

## 2024-03-01 ENCOUNTER — Other Ambulatory Visit (HOSPITAL_COMMUNITY): Payer: Self-pay

## 2024-03-01 ENCOUNTER — Encounter (HOSPITAL_COMMUNITY): Payer: Self-pay

## 2024-03-02 ENCOUNTER — Other Ambulatory Visit (HOSPITAL_COMMUNITY): Payer: Self-pay

## 2024-03-02 MED ORDER — PROGESTERONE MICRONIZED 100 MG PO CAPS
100.0000 mg | ORAL_CAPSULE | Freq: Every day | ORAL | 0 refills | Status: DC
  Filled 2024-03-02 – 2024-03-18 (×4): qty 90, 90d supply, fill #0

## 2024-03-11 ENCOUNTER — Other Ambulatory Visit (HOSPITAL_COMMUNITY): Payer: Self-pay

## 2024-03-12 ENCOUNTER — Other Ambulatory Visit (HOSPITAL_COMMUNITY): Payer: Self-pay

## 2024-03-14 DIAGNOSIS — R87612 Low grade squamous intraepithelial lesion on cytologic smear of cervix (LGSIL): Secondary | ICD-10-CM | POA: Diagnosis not present

## 2024-03-14 DIAGNOSIS — R87810 Cervical high risk human papillomavirus (HPV) DNA test positive: Secondary | ICD-10-CM | POA: Diagnosis not present

## 2024-03-18 ENCOUNTER — Other Ambulatory Visit: Payer: Self-pay

## 2024-03-18 ENCOUNTER — Other Ambulatory Visit (HOSPITAL_COMMUNITY): Payer: Self-pay

## 2024-03-19 ENCOUNTER — Other Ambulatory Visit (HOSPITAL_COMMUNITY): Payer: Self-pay

## 2024-03-28 DIAGNOSIS — D225 Melanocytic nevi of trunk: Secondary | ICD-10-CM | POA: Diagnosis not present

## 2024-03-28 DIAGNOSIS — N879 Dysplasia of cervix uteri, unspecified: Secondary | ICD-10-CM | POA: Diagnosis not present

## 2024-03-28 DIAGNOSIS — L814 Other melanin hyperpigmentation: Secondary | ICD-10-CM | POA: Diagnosis not present

## 2024-03-28 DIAGNOSIS — L821 Other seborrheic keratosis: Secondary | ICD-10-CM | POA: Diagnosis not present

## 2024-03-28 DIAGNOSIS — L720 Epidermal cyst: Secondary | ICD-10-CM | POA: Diagnosis not present

## 2024-03-29 ENCOUNTER — Other Ambulatory Visit: Payer: Self-pay | Admitting: Medical Genetics

## 2024-03-29 DIAGNOSIS — Z006 Encounter for examination for normal comparison and control in clinical research program: Secondary | ICD-10-CM

## 2024-04-02 ENCOUNTER — Other Ambulatory Visit (HOSPITAL_COMMUNITY): Payer: Self-pay

## 2024-04-05 ENCOUNTER — Encounter (HOSPITAL_BASED_OUTPATIENT_CLINIC_OR_DEPARTMENT_OTHER): Payer: Self-pay | Admitting: Cardiovascular Disease

## 2024-04-06 ENCOUNTER — Other Ambulatory Visit: Payer: Self-pay

## 2024-04-06 ENCOUNTER — Other Ambulatory Visit (HOSPITAL_COMMUNITY): Payer: Self-pay

## 2024-04-09 ENCOUNTER — Other Ambulatory Visit (HOSPITAL_COMMUNITY): Payer: Self-pay

## 2024-04-21 ENCOUNTER — Encounter (HOSPITAL_BASED_OUTPATIENT_CLINIC_OR_DEPARTMENT_OTHER): Payer: Self-pay

## 2024-04-22 ENCOUNTER — Other Ambulatory Visit (HOSPITAL_COMMUNITY): Payer: Self-pay

## 2024-04-22 ENCOUNTER — Other Ambulatory Visit: Payer: Self-pay

## 2024-04-22 ENCOUNTER — Ambulatory Visit (INDEPENDENT_AMBULATORY_CARE_PROVIDER_SITE_OTHER): Admitting: Family

## 2024-04-22 ENCOUNTER — Encounter (HOSPITAL_BASED_OUTPATIENT_CLINIC_OR_DEPARTMENT_OTHER): Payer: Self-pay | Admitting: Family

## 2024-04-22 VITALS — BP 110/86 | Ht 64.0 in | Wt 118.4 lb

## 2024-04-22 DIAGNOSIS — E785 Hyperlipidemia, unspecified: Secondary | ICD-10-CM | POA: Diagnosis not present

## 2024-04-22 DIAGNOSIS — I491 Atrial premature depolarization: Secondary | ICD-10-CM

## 2024-04-22 DIAGNOSIS — I493 Ventricular premature depolarization: Secondary | ICD-10-CM

## 2024-04-22 DIAGNOSIS — I25118 Atherosclerotic heart disease of native coronary artery with other forms of angina pectoris: Secondary | ICD-10-CM

## 2024-04-22 DIAGNOSIS — R002 Palpitations: Secondary | ICD-10-CM | POA: Diagnosis not present

## 2024-04-22 MED ORDER — METOPROLOL SUCCINATE ER 25 MG PO TB24
25.0000 mg | ORAL_TABLET | Freq: Every day | ORAL | 3 refills | Status: AC
  Filled 2024-04-22 – 2024-04-24 (×2): qty 90, 90d supply, fill #0

## 2024-04-22 MED ORDER — ROSUVASTATIN CALCIUM 5 MG PO TABS
5.0000 mg | ORAL_TABLET | Freq: Every day | ORAL | 3 refills | Status: DC
  Filled 2024-04-24: qty 90, 90d supply, fill #0

## 2024-04-22 NOTE — Patient Instructions (Signed)
 Medication Instructions:  Continue your current medications.  *If you need a refill on your cardiac medications before your next appointment, please call your pharmacy*  Lab Work: Your physician recommends that you return for lab work today: cholesterol panel, Lp(a)  If you have labs (blood work) drawn today and your tests are completely normal, you will receive your results only by: MyChart Message (if you have MyChart) OR A paper copy in the mail If you have any lab test that is abnormal or we need to change your treatment, we will call you to review the results.  Testing/Procedures: Your EKG today looked normal which is reassuring result!  Follow-Up: At [facility], you and your health needs are our priority.  As part of our continuing mission to provide you with exceptional heart care, our providers are all part of one team.  This team includes your primary Cardiologist (physician) and Advanced Practice Providers or APPs (Physician Assistants and Nurse Practitioners) who all work together to provide you with the care you need, when you need it.  Your next appointment:   1 year(s)  Provider:   Annabella Scarce, MD, Rosaline Bane, NP, or Reche Finder, NP    We recommend signing up for the patient portal called MyChart.  Sign up information is provided on this After Visit Summary.  MyChart is used to connect with patients for Virtual Visits (Telemedicine).  Patients are able to view lab/test results, encounter notes, upcoming appointments, etc.  Non-urgent messages can be sent to your provider as well.   To learn more about what you can do with MyChart, go to [email or web address].   Other Instructions  Heart Healthy Diet Recommendations: A low-salt diet is recommended. Meats should be grilled, baked, or boiled. Avoid fried foods. Focus on lean protein sources like fish or chicken with vegetables and fruits. The American Heart Association is a Chief Technology Officer!   American Heart Association Diet and Lifeystyle Recommendations   Exercise recommendations: The American Heart Association recommends 150 minutes of moderate intensity exercise weekly. Try 30 minutes of moderate intensity exercise 4-5 times per week. This could include walking, jogging, or swimming.

## 2024-04-22 NOTE — Progress Notes (Signed)
 Cardiology Office Note:  .   Date:  04/22/2024  ID:  Lauren [name], DOB 1966-02-01, MRN [id number] PCP: Regino Slater, MD  [facility] Providers Cardiologist:  Annabella Scarce, MD    History of Present Illness: .   Lauren [name] is a 58 y.o. 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  She was last seen 06/03/2021.  Seen 06/21/2021 by Orren Fabry, PA.  She was recommended to continue Toprol  25 mg daily for palpitations and due to new symptoms ZIO monitor was recommended.  Monitor 08/2021 revealed normal sinus rhythm with rare PAC/PVC and up to 6 beats of SVT.  Coronary calcium  score 09/13/2021 with coronary calcium  score of 7 placing her in the 80th percentile for age/gender/race matched control with aortic atherosclerosis.  Aspirin  and rosuvastatin  were recommended and she politely declined statin.  At visit 11/11/22 she had rare PAC which were asymtpomatic, her LDL was 75 and recommended to continue Rosuvastatin  and repeat lipid panel in 6-12 months. She had no anginal symptoms.   Presents today for follow up independently. 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 Exercises regularly with hot yoga and strength class 3x per week. Palpitations only occasionally which are most often associated with migraine. They self resolve.  Following low sodium, heart healthy diet. No exertional chest pain, no dyspnea, orthopnea, edema.  ROS: Please see the history of present illness.    All other systems reviewed and are negative.   Studies Reviewed: SABRA    EKG Interpretation Date/Time:  Friday April 22 2024 11:01:06 EST Ventricular Rate:  68 PR Interval:  158 QRS Duration:  74 QT Interval:  412 QTC Calculation: 438 R Axis:   4  Text Interpretation: Normal sinus rhythm  No acute ST/T wave changes Confirmed by Vannie Mora ([id number]) on 04/22/2024 11:04:23 AM     Risk Assessment/Calculations:              Physical Exam:   VS:  BP 110/86 (BP Location: Left Arm, Patient Position: Sitting, Cuff Size: Normal)   Ht 5' 4 (1.626 m)   Wt 118 lb 6.4 oz (53.7 kg)   LMP 10/13/2013   SpO2 100%   BMI 20.32 kg/m    Wt Readings from Last 3 Encounters:  04/22/24 118 lb 6.4 oz (53.7 kg)  10/14/23 117 lb (53.1 kg)  11/11/22 108 lb (49 kg)    GEN: Well nourished, well developed in no acute distress NECK: No JVD; No carotid bruits CARDIAC: RRR, no murmurs, rubs, gallops RESPIRATORY:  Clear to auscultation without rales, wheezing or rhonchi  ABDOMEN: Soft, non-tender, non-distended EXTREMITIES:  No edema; No deformity   ASSESSMENT AND PLAN: .    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 No HF symptoms. No indication for further evaluation at this time.   PAC (premature atrial contraction) Rare palpitations which are overall not bothersome. Continue Toprol  25mg  QD. Refill provided.   Hyperlipidemia LDL goal <70. Update lipid panel, Lpa today. Continue Rosuvastatin  5mg  daily, can reassess pending lab results, refill provided.   Coronary artery disease of native artery of native heart with stable angina pectoris (HCC) Stable with no anginal symptoms. No indication for ischemic evaluation.  GDMT Rosuvastatin , Toprol . Recommend aiming for 150 minutes of moderate intensity activity per week and following a heart healthy diet.          Dispo: follow up in 1 year with Dr. Scarce  Signed, Mora RAMAN  Vannie, NP

## 2024-04-23 ENCOUNTER — Other Ambulatory Visit (HOSPITAL_COMMUNITY): Payer: Self-pay

## 2024-04-24 ENCOUNTER — Other Ambulatory Visit (HOSPITAL_COMMUNITY): Payer: Self-pay

## 2024-04-25 ENCOUNTER — Encounter (HOSPITAL_BASED_OUTPATIENT_CLINIC_OR_DEPARTMENT_OTHER): Payer: Self-pay

## 2024-04-26 ENCOUNTER — Ambulatory Visit (HOSPITAL_BASED_OUTPATIENT_CLINIC_OR_DEPARTMENT_OTHER): Payer: Self-pay | Admitting: Family

## 2024-04-26 ENCOUNTER — Other Ambulatory Visit (HOSPITAL_COMMUNITY): Payer: Self-pay

## 2024-04-26 DIAGNOSIS — E782 Mixed hyperlipidemia: Secondary | ICD-10-CM

## 2024-04-26 DIAGNOSIS — E785 Hyperlipidemia, unspecified: Secondary | ICD-10-CM

## 2024-04-26 DIAGNOSIS — Z79899 Other long term (current) drug therapy: Secondary | ICD-10-CM

## 2024-04-26 DIAGNOSIS — I25118 Atherosclerotic heart disease of native coronary artery with other forms of angina pectoris: Secondary | ICD-10-CM

## 2024-04-26 LAB — LIPID PANEL
Chol/HDL Ratio: 2.4 ratio (ref 0.0–4.4)
Cholesterol, Total: 178 mg/dL (ref 100–199)
HDL: 75 mg/dL (ref >39)
LDL Chol Calc (NIH): 90 mg/dL (ref 0–99)
Triglycerides: 70 mg/dL (ref 0–149)
VLDL Cholesterol Cal: 13 mg/dL (ref 5–40)

## 2024-04-26 LAB — LIPOPROTEIN A (LPA): Lipoprotein (a): <8.4 nmol/L (ref <75.0)

## 2024-04-26 MED ORDER — ROSUVASTATIN CALCIUM 10 MG PO TABS
10.0000 mg | ORAL_TABLET | Freq: Every day | ORAL | 1 refills | Status: AC
  Filled 2024-04-26 – 2024-04-27 (×2): qty 90, 90d supply, fill #0

## 2024-04-26 NOTE — Telephone Encounter (Signed)
-----   Message from Reche GORMAN Finder sent at 04/26/2024 10:29 AM EST ----- Normal Lp(a) no evidence of familial hyperlipidemia (high cholesterol related to genetics). LDL (bad cholesterol)  no yet at goal <70. Recommend increase Rosuvastatin  to 10mg  daily with repeat  fasting lipid panel and ALT in 6-8 weeks. ----- Message ----- From: Rebecka Memos Lab Results In Sent: 04/26/2024   3:36 AM EST To: Reche GORMAN Finder, NP

## 2024-04-26 NOTE — Telephone Encounter (Signed)
 Results were sent to the pts active mychart account to review by Reche Finder, NP.   Pt did view this. Last read by Ailish A Splawn at 10:39AM on 04/26/2024.    Will send the pt a message informing her that we will send in increased crestor  10 mg po daily to her pharmacy on file, and advise her to mark down on the calendar to come in for repeat lipids/ALT in 6-8 weeks (around 06/21/24).   Will advise her to come fasting to that lab appt.

## 2024-04-27 ENCOUNTER — Other Ambulatory Visit (HOSPITAL_COMMUNITY): Payer: Self-pay

## 2024-04-27 ENCOUNTER — Other Ambulatory Visit: Payer: Self-pay

## 2024-04-30 ENCOUNTER — Other Ambulatory Visit (HOSPITAL_COMMUNITY): Payer: Self-pay

## 2024-05-02 ENCOUNTER — Other Ambulatory Visit (HOSPITAL_COMMUNITY): Payer: Self-pay

## 2024-05-24 ENCOUNTER — Other Ambulatory Visit (HOSPITAL_COMMUNITY): Payer: Self-pay

## 2024-05-30 ENCOUNTER — Other Ambulatory Visit: Payer: Self-pay

## 2024-05-30 ENCOUNTER — Other Ambulatory Visit (HOSPITAL_COMMUNITY): Payer: Self-pay

## 2024-05-30 MED ORDER — ESTRADIOL 0.05 MG/24HR TD PTTW
1.0000 | MEDICATED_PATCH | TRANSDERMAL | 1 refills | Status: AC
  Filled 2024-05-30: qty 24, 84d supply, fill #0

## 2024-06-07 ENCOUNTER — Other Ambulatory Visit (HOSPITAL_COMMUNITY): Payer: Self-pay

## 2024-06-13 ENCOUNTER — Other Ambulatory Visit (HOSPITAL_COMMUNITY): Payer: Self-pay

## 2024-06-13 MED ORDER — PROGESTERONE MICRONIZED 100 MG PO CAPS
100.0000 mg | ORAL_CAPSULE | Freq: Every day | ORAL | 3 refills | Status: AC
  Filled 2024-06-13: qty 90, 90d supply, fill #0

## 2024-06-21 ENCOUNTER — Other Ambulatory Visit (HOSPITAL_COMMUNITY): Payer: Self-pay

## 2024-06-22 ENCOUNTER — Other Ambulatory Visit (HOSPITAL_COMMUNITY): Payer: Self-pay

## 2024-06-27 ENCOUNTER — Other Ambulatory Visit (HOSPITAL_COMMUNITY): Payer: Self-pay

## 2024-06-29 ENCOUNTER — Telehealth (HOSPITAL_COMMUNITY): Payer: Self-pay

## 2024-06-29 ENCOUNTER — Other Ambulatory Visit (HOSPITAL_COMMUNITY): Payer: Self-pay

## 2024-06-29 NOTE — Telephone Encounter (Signed)
 Where is this request coming from? Lauren [name]  Medication: Emgality  120 mg Prior authorization required? Yes If YES, on primary or secondary insurance? Primary Comments:

## 2024-06-30 ENCOUNTER — Other Ambulatory Visit (HOSPITAL_COMMUNITY): Payer: Self-pay

## 2024-07-04 ENCOUNTER — Encounter (HOSPITAL_COMMUNITY): Payer: Self-pay

## 2024-07-05 ENCOUNTER — Other Ambulatory Visit (HOSPITAL_COMMUNITY): Payer: Self-pay

## 2024-07-05 ENCOUNTER — Telehealth: Payer: Self-pay

## 2024-07-05 NOTE — Telephone Encounter (Signed)
 Pharmacy Patient Advocate Encounter   Received notification from Pt Calls Messages that prior authorization for Emgality  120MG /ML auto-injectors (migraine) is required/requested.   Insurance verification completed.   The patient is insured through [facility]   Per test claim: PA required; PA submitted to above mentioned insurance via Latent Key/confirmation #/EOC [facility] Status is pending

## 2024-07-07 ENCOUNTER — Other Ambulatory Visit (HOSPITAL_COMMUNITY): Payer: Self-pay

## 2024-10-19 ENCOUNTER — Telehealth: Admitting: Neurology
